# Patient Record
Sex: Female | Born: 1990 | Hispanic: Yes | Marital: Married | Smoking: Never smoker
Health system: Southern US, Community
[De-identification: ages and names within clinical notes are randomized; demographics above are authoritative.]

## PROBLEM LIST (undated history)

## (undated) ENCOUNTER — Inpatient Hospital Stay (HOSPITAL_COMMUNITY): Payer: Self-pay

## (undated) DIAGNOSIS — C539 Malignant neoplasm of cervix uteri, unspecified: Secondary | ICD-10-CM

## (undated) HISTORY — PX: LEEP: SHX91

## (undated) HISTORY — PX: DILATION AND CURETTAGE OF UTERUS: SHX78

---

## 2014-12-07 ENCOUNTER — Inpatient Hospital Stay (HOSPITAL_COMMUNITY)
Admission: AD | Admit: 2014-12-07 | Discharge: 2014-12-07 | Disposition: A | Discharge status: Home / Self Care | Payer: Self-pay | Source: Ambulatory Visit | Attending: Family Medicine | Admitting: Family Medicine

## 2014-12-07 ENCOUNTER — Inpatient Hospital Stay (HOSPITAL_COMMUNITY): Payer: Self-pay

## 2014-12-07 DIAGNOSIS — O209 Hemorrhage in early pregnancy, unspecified: Secondary | ICD-10-CM

## 2014-12-07 DIAGNOSIS — Z3A11 11 weeks gestation of pregnancy: Secondary | ICD-10-CM | POA: Insufficient documentation

## 2014-12-07 LAB — URINALYSIS, ROUTINE W REFLEX MICROSCOPIC
BILIRUBIN URINE: NEGATIVE
Glucose, UA: NEGATIVE mg/dL
Ketones, ur: NEGATIVE mg/dL
LEUKOCYTES UA: NEGATIVE
NITRITE: NEGATIVE
PROTEIN: NEGATIVE mg/dL
SPECIFIC GRAVITY, URINE: 1.015 (ref 1.005–1.030)
UROBILINOGEN UA: 0.2 mg/dL (ref 0.0–1.0)
pH: 8 (ref 5.0–8.0)

## 2014-12-07 LAB — CBC
HCT: 40.2 % (ref 36.0–46.0)
Hemoglobin: 13.5 g/dL (ref 12.0–15.0)
MCH: 29.2 pg (ref 26.0–34.0)
MCHC: 33.6 g/dL (ref 30.0–36.0)
MCV: 87 fL (ref 78.0–100.0)
Platelets: 189 10*3/uL (ref 150–400)
RBC: 4.62 MIL/uL (ref 3.87–5.11)
RDW: 12.4 % (ref 11.5–15.5)
WBC: 8 10*3/uL (ref 4.0–10.5)

## 2014-12-07 LAB — BASIC METABOLIC PANEL
ANION GAP: 9 (ref 5–15)
BUN: 13 mg/dL (ref 6–23)
CALCIUM: 9.7 mg/dL (ref 8.4–10.5)
CO2: 27 mmol/L (ref 19–32)
CREATININE: 0.79 mg/dL (ref 0.50–1.10)
Chloride: 105 mEq/L (ref 96–112)
GFR calc non Af Amer: >90 mL/min (ref >90)
Glucose, Bld: 99 mg/dL (ref 70–99)
Potassium: 3.7 mmol/L (ref 3.5–5.1)
Sodium: 141 mmol/L (ref 135–145)

## 2014-12-07 LAB — URINE MICROSCOPIC-ADD ON

## 2014-12-07 LAB — POCT PREGNANCY, URINE: Preg Test, Ur: POSITIVE — AB

## 2014-12-07 LAB — HCG, QUANTITATIVE, PREGNANCY: hCG, Beta Chain, Quant, S: 13687 m[IU]/mL — ABNORMAL HIGH (ref <5)

## 2014-12-07 NOTE — MAU Note (Signed)
Pt reports pain in lower abd, vaginal bleeding x 5 days. LMP10/16

## 2014-12-07 NOTE — Discharge Instructions (Signed)
Aborto espontneo  (Miscarriage) El aborto espontneo es la prdida de un beb que no ha nacido (feto) antes de la semana 20 del Media planner. La mayor parte de estos abortos ocurre en los primeros 3 meses. En algunos casos ocurre antes de que la mujer sepa que est [place]. Tambin se denomina "aborto espontneo" o "prdida prematura del embarazo". El aborto espontneo puede ser Ardelia Mems experiencia que afecte emocionalmente a Geologist, engineering. Converse con su mdico si tiene dudas, cmo es el proceso de [place], y sobre planes futuros de Media planner.  CAUSAS   Algunos problemas cromosmicos pueden hacer imposible que el beb se desarrolle normalmente. Los problemas con los genes o cromosomas del beb son generalmente el resultado de errores que se producen, por casualidad, cuando el embrin se divide y crece. Estos problemas no se heredan de los [place].  Infeccin en el cuello del tero.   Problemas hormonales.   Problemas en el cuello del tero, como tener un tero incompetente. Esto ocurre cuando los tejidos no son lo suficientemente fuertes como para Risk manager.   Problemas del tero, como un tero con forma anormal, los fibromas o anormalidades congnitas.   Ciertas enfermedades crnicas.   No fume, no beba alcohol, ni consuma drogas.   Traumatismos  A veces, la causa es desconocida.  SNTOMAS   Sangrado o manchado vaginal, con o sin clicos o dolor.  Dolor o clicos en el abdomen o en la cintura.  Eliminacin de lquido, tejidos o cogulos grandes por la vagina. DIAGNSTICO  El [employer] har un examen fsico. Tambin le indicar una ecografa para confirmar el aborto. Es posible que se realicen anlisis de [place].  TRATAMIENTO   En algunos casos el tratamiento no es necesario, si se eliminan naturalmente todos los tejidos embrionarios que se encontraban en el tero. Si el feto o la placenta quedan dentro del tero (aborto incompleto), pueden infectarse, los tejidos que quedan  pueden infectarse y deben retirarse. Generalmente se realiza un procedimiento de dilatacin y curetaje (D y C). Durante el procedimiento de dilatacin y curetaje, el cuello del tero se abre (dilata) y se retira cualquier resto de tejido fetal o placentario del tero.  Si hay una infeccin, le recetarn antibiticos. Podrn recetarle otros medicamentos para reducir el tamao del tero (contraerlo) si hay una mucho sangrado.  Si su sangre es Rh negativa y su beb es Rh positivo, usted necesitar la inyeccin de inmunoglobulina Rh. Esta inyeccin proteger a los futuros bebs de tener problemas de compatibilidad Rh en futuros embarazos. INSTRUCCIONES PARA EL CUIDADO EN EL HOGAR   El mdico le indicar reposo en cama o le permitir Automotive engineer. Vuelva a la actividad lentamente o segn las indicaciones de su mdico.  Pdale a alguien que la ayude con las responsabilidades familiares y del hogar durante este tiempo.   Lleve un registro de la cantidad y la saturacin de las toallas higinicas que Medical laboratory scientific officer. Anote esta informacin   No use tampones. No No se haga duchas vaginales ni tenga relaciones sexuales hasta que el mdico la autorice.   Slo tome medicamentos de venta libre o recetados para Glass blower/designer o Health and safety inspector, segn las indicaciones de su mdico.   No tome aspirina. La aspirina puede ocasionar hemorragias.   Concurra puntualmente a las citas de control con el mdico.   Si usted o su pareja tienen dificultades con el duelo, hable con su mdico para buscar la ayuda psicolgica que los ayude a enfrentar la prdida  del embarazo. Permtase el tiempo suficiente de duelo antes de quedar embarazada nuevamente.  SOLICITE ATENCIN MDICA DE INMEDIATO SI:   Siente calambres intensos o dolor en la espalda o en el abdomen.  Tiene fiebre.  Elimina grandes cogulos de [place] (del tamao de una nuez o ms) o tejidos por la vagina. Guarde lo que ha eliminado para  que su mdico lo examine.   La hemorragia aumenta.   [place] Fast secrecin vaginal espesa y con mal olor.  Se siente mareada, dbil, o se desmaya.   Siente escalofros.  ASEGRESE DE QUE:   Comprende estas instrucciones.  Controlar su enfermedad.  Solicitar ayuda de inmediato si no mejora o si empeora. Document Released: 08/30/2005 Document Revised: 03/17/2013 [facility] Patient Information 2015 [place], [place]. This information is not intended to replace advice given to you by your health care provider. Make sure you discuss any questions you have with your health care provider.

## 2014-12-07 NOTE — MAU Provider Note (Signed)
History     CSN: [id number]  Arrival date and time: 12/07/14 1845   None     Chief Complaint  Patient presents with  . Possible Pregnancy  . Vaginal Bleeding  . Abdominal Pain   HPI  Kim Sanders is a 24 y.o. G3P2 at 11 weeks based on LMP. She presents today with spotting and cramping. She states that it started yesterday, but has gotten worse today. She reports that her bleeding is now like a period. She denies passing any tissue at this time.   No past medical history on file.  No past surgical history on file.  No family history on file.  History  Substance Use Topics  . Smoking status: Not on file  . Smokeless tobacco: Not on file  . Alcohol Use: Not on file    Allergies: Allergies not on file  No prescriptions prior to admission    ROS Physical Exam   Blood pressure 122/67, pulse 66, temperature 99.9 F (37.7 C), temperature source Oral, resp. rate 18, height 5\' 5"  (1.651 m), weight 68.947 kg (152 lb), last menstrual period 09/18/2014, SpO2 100 %.  Physical Exam  Nursing note and vitals reviewed. Constitutional: She is oriented to person, place, and time. She appears well-developed and well-nourished. No distress.  Cardiovascular: Normal rate.   Respiratory: Effort normal.  GI: Soft. There is no tenderness. There is no rebound.  Neurological: She is alert and oriented to person, place, and time.  Skin: Skin is warm and dry.  Psychiatric: She has a normal mood and affect.    MAU Course  Procedures  Results for orders placed or performed during the hospital encounter of 12/07/14 (from the past 24 hour(s))  Urinalysis, Routine w reflex microscopic     Status: Abnormal   Collection Time: 12/07/14  8:09 PM  Result Value Ref Range   Color, Urine YELLOW YELLOW   APPearance HAZY (A) CLEAR   Specific Gravity, Urine 1.015 1.005 - 1.030   pH 8.0 5.0 - 8.0   Glucose, UA NEGATIVE NEGATIVE mg/dL   Hgb urine dipstick LARGE (A) NEGATIVE   Bilirubin Urine  NEGATIVE NEGATIVE   Ketones, ur NEGATIVE NEGATIVE mg/dL   Protein, ur NEGATIVE NEGATIVE mg/dL   Urobilinogen, UA 0.2 0.0 - 1.0 mg/dL   Nitrite NEGATIVE NEGATIVE   Leukocytes, UA NEGATIVE NEGATIVE  Urine microscopic-add on     Status: Abnormal   Collection Time: 12/07/14  8:09 PM  Result Value Ref Range   Squamous Epithelial / LPF FEW (A) RARE   WBC, UA 0-2 <3 WBC/hpf   RBC / HPF 3-6 <3 RBC/hpf   Bacteria, UA RARE RARE  Pregnancy, urine POC     Status: Abnormal   Collection Time: 12/07/14  8:23 PM  Result Value Ref Range   Preg Test, Ur POSITIVE (A) NEGATIVE  hCG, quantitative, pregnancy     Status: Abnormal   Collection Time: 12/07/14  8:48 PM  Result Value Ref Range   hCG, Beta Chain, Quant, S 13687 (H) <5 mIU/mL  Basic metabolic panel     Status: None   Collection Time: 12/07/14  8:48 PM  Result Value Ref Range   Sodium 141 135 - 145 mmol/L   Potassium 3.7 3.5 - 5.1 mmol/L   Chloride 105 96 - 112 mEq/L   CO2 27 19 - 32 mmol/L   Glucose, Bld 99 70 - 99 mg/dL   BUN 13 6 - 23 mg/dL   Creatinine, Ser 0.79 0.50 - 1.10  mg/dL   Calcium 9.7 8.4 - 10.5 mg/dL   GFR calc non Af Amer >90 >90 mL/min   GFR calc Af Amer >90 >90 mL/min   Anion gap 9 5 - 15  CBC     Status: None   Collection Time: 12/07/14  8:48 PM  Result Value Ref Range   WBC 8.0 4.0 - 10.5 K/uL   RBC 4.62 3.87 - 5.11 MIL/uL   Hemoglobin 13.5 12.0 - 15.0 g/dL   HCT 40.2 36.0 - 46.0 %   MCV 87.0 78.0 - 100.0 fL   MCH 29.2 26.0 - 34.0 pg   MCHC 33.6 30.0 - 36.0 g/dL   RDW 12.4 11.5 - 15.5 %   Platelets 189 150 - 400 K/uL   US Ob Comp Less 14 Wks  12/07/2014   CLINICAL DATA:  Bleeding and pelvic pain. Estimated gestational age by LMP is 11 weeks 3 days. Quantitative beta HCG is pending.  EXAM: OBSTETRIC <14 WK [place] AND TRANSVAGINAL OB US  TECHNIQUE: Both transabdominal and transvaginal ultrasound examinations were performed for complete evaluation of the gestation as well as the maternal uterus, adnexal regions, and  pelvic cul-de-sac. Transvaginal technique was performed to assess early pregnancy.  COMPARISON:  None.  FINDINGS: Intrauterine gestational sac: A single intrauterine gestational sac is identified. Sac size appear somewhat large compared to the size of the fetal pole.  Yolk sac:  Yolk sac is not visualized.  Embryo:  Fetal pole is identified.  Cardiac Activity: Fetal cardiac activity is not identified.  Heart Rate:  0 bpm  MSD:  24.9  mm   7 w   5  d  CRL:   5.3  mm   6 w 3 d                  [place] EDC: 07/25/2015  Maternal uterus/adnexae: The uterus is anteverted. No myometrial mass lesions are identified. Small subchorionic hemorrhage is suggested. Both ovaries are visualized and appear normal. No free pelvic fluid collections.  IMPRESSION: A single intrauterine gestational sac is identified. The fetal pole is identified but no fetal cardiac activity is shown. Crown-rump length measures 5.3 mm consistent with estimated gestational age of [redacted] weeks 3 days, small compared with reported LMP. Findings are suspicious but not yet definitive for failed pregnancy. Recommend follow-up US in 10-14 days for definitive diagnosis. This recommendation follows SRU consensus guidelines: Diagnostic Criteria for Nonviable Pregnancy Early in the First Trimester. [place] Med 2013; 629:5284-13.   Electronically Signed   By: Lucienne Capers M.D.   On: 12/07/2014 21:30   US Ob Transvaginal  12/07/2014   CLINICAL DATA:  Bleeding and pelvic pain. Estimated gestational age by LMP is 11 weeks 3 days. Quantitative beta HCG is pending.  EXAM: OBSTETRIC <14 WK [place] AND TRANSVAGINAL OB US  TECHNIQUE: Both transabdominal and transvaginal ultrasound examinations were performed for complete evaluation of the gestation as well as the maternal uterus, adnexal regions, and pelvic cul-de-sac. Transvaginal technique was performed to assess early pregnancy.  COMPARISON:  None.  FINDINGS: Intrauterine gestational sac: A single intrauterine gestational sac is  identified. Sac size appear somewhat large compared to the size of the fetal pole.  Yolk sac:  Yolk sac is not visualized.  Embryo:  Fetal pole is identified.  Cardiac Activity: Fetal cardiac activity is not identified.  Heart Rate:  0 bpm  MSD:  24.9  mm   7 w   5  d  CRL:  5.3  mm   6 w 3 d                  [place] EDC: 07/25/2015  Maternal uterus/adnexae: The uterus is anteverted. No myometrial mass lesions are identified. Small subchorionic hemorrhage is suggested. Both ovaries are visualized and appear normal. No free pelvic fluid collections.  IMPRESSION: A single intrauterine gestational sac is identified. The fetal pole is identified but no fetal cardiac activity is shown. Crown-rump length measures 5.3 mm consistent with estimated gestational age of [redacted] weeks 3 days, small compared with reported LMP. Findings are suspicious but not yet definitive for failed pregnancy. Recommend follow-up US in 10-14 days for definitive diagnosis. This recommendation follows SRU consensus guidelines: Diagnostic Criteria for Nonviable Pregnancy Early in the First Trimester. [place] [employer] Med 2013; 599:3570-17.   Electronically Signed   By: Lucienne Capers M.D.   On: 12/07/2014 21:30    Assessment and Plan   1. Bleeding in early pregnancy    D/W the patient at length that this likely represents SAB To confirm we will repeat HCG in 48 hours SAB and bleeding precautions reviewed Return to MAU as needed   Follow-up Information    Follow up with [facility] In 2 days.   Contact information:   [address] [id number] mc [place] [phone]       Mathis Bud 12/07/2014, 10:02 PM

## 2014-12-08 LAB — RPR

## 2014-12-08 LAB — HIV ANTIBODY (ROUTINE TESTING W REFLEX): HIV 1&2 Ab, 4th Generation: NONREACTIVE

## 2014-12-09 ENCOUNTER — Inpatient Hospital Stay (HOSPITAL_COMMUNITY)
Admission: AD | Admit: 2014-12-09 | Discharge: 2014-12-09 | Disposition: A | Discharge status: Home / Self Care | Payer: Self-pay | Source: Ambulatory Visit | Attending: Family Medicine | Admitting: Family Medicine

## 2014-12-09 ENCOUNTER — Encounter (HOSPITAL_COMMUNITY): Payer: Self-pay | Admitting: *Deleted

## 2014-12-09 DIAGNOSIS — Z3A00 Weeks of gestation of pregnancy not specified: Secondary | ICD-10-CM | POA: Insufficient documentation

## 2014-12-09 DIAGNOSIS — O039 Complete or unspecified spontaneous abortion without complication: Secondary | ICD-10-CM | POA: Insufficient documentation

## 2014-12-09 LAB — CBC
HCT: 35.6 % — ABNORMAL LOW (ref 36.0–46.0)
Hemoglobin: 12.3 g/dL (ref 12.0–15.0)
MCH: 30.1 pg (ref 26.0–34.0)
MCHC: 34.6 g/dL (ref 30.0–36.0)
MCV: 87.3 fL (ref 78.0–100.0)
PLATELETS: 168 10*3/uL (ref 150–400)
RBC: 4.08 MIL/uL (ref 3.87–5.11)
RDW: 12.3 % (ref 11.5–15.5)
WBC: 7.1 10*3/uL (ref 4.0–10.5)

## 2014-12-09 LAB — HCG, QUANTITATIVE, PREGNANCY: HCG, BETA CHAIN, QUANT, S: 5396 m[IU]/mL — AB (ref <5)

## 2014-12-09 NOTE — Discharge Instructions (Signed)
Aborto espontáneo  °(Miscarriage) ° El aborto espontáneo es la pérdida de un bebé que no ha nacido.(feto) antes de la semana 20 del embarazo. La causa generalmente es desconocida.  °CUIDADOS EN EL HOGAR  °· Debe permanecer en cama (reposo en cama) o podrá hacer actividades livianas. Regrese a sus actividades según las indicaciones del médico. °· Pida ayuda con las tareas domésticas. °· Anote cuántos apósitos usa por día. Describa el grado en que están empapados. °· No use tampones. No se higienice la vagina (duchas vaginales) ni tenga relaciones sexuales (coito) hasta que el médico la autorice. °· Sólo debe tomar la medicación según las indicaciones del médico. °· No tome aspirina. °· Cumpla con los controles médicos según las indicaciones. °· Si usted o su pareja tienen problemas con el duelo, hable con su médico. También puede intentar con psicoterapia. Permítase el tiempo suficiente de duelo antes de quedar embarazada nuevamente. °SOLICITE AYUDA DE INMEDIATO SI:  °· Siente cólicos intensos o dolor en el estómago, en la espalda o en el vientre (abdomen). °· Tiene fiebre. °· Elimina grumos de sangre (coágulos) por la vagina, que tienen el tamaño de una nuez o más. Guarde los coágulos para que el médico los vea. °· Elimina gran cantidad de tejidos por la vagina. Guarde lo que ha eliminado para que su médico lo examine. °· Aumenta el sangrado. °· Observa una secreción espesa, con mal olor (pérdida) que proviene de la vagina. °· Se siente mareada, débil o se desvanece (se desmaya). °· Siente escalofríos. °ASEGÚRESE DE QUE:  °· Comprende estas instrucciones. °· Controlará su enfermedad. °· Solicitará ayuda de inmediato si no mejora o si empeora. °Document Released: 05/21/2012 °ExitCare® Patient Information ©2015 ExitCare, LLC. This information is not intended to replace advice given to you by your health care provider. Make sure you discuss any questions you have with your health care provider. ° °

## 2014-12-09 NOTE — MAU Provider Note (Signed)
Subjective:  [name] is a 24 y.o. female G1P0 at Unknown gestation who presents to MAU for a follow up beta hcg level. She was originally seen on 1/4 for pain and bleeding in early pregnancy. She had an US done that was suspicious for a failed IUP.    She had heavy vaginal bleeding: however today it is very light.  Currently denies pain.   Objective:  GENERAL: Well-developed, well-nourished female in no acute distress.  HEENT: Normocephalic, atraumatic.   LUNGS: Effort normal HEART: Regular rate  SKIN: Warm, dry and without erythema ABDOMEN: soft, non tender  PSYCH: Normal mood and affect  Filed Vitals:   12/09/14 0901  BP: 111/65  Pulse: 84  Temp: 98.6 F (37 C)  Resp: 18   Results for orders placed or performed during the hospital encounter of 12/09/14 (from the past 48 hour(s))  hCG, quantitative, pregnancy     Status: Abnormal   Collection Time: 12/09/14  8:56 AM  Result Value Ref Range   hCG, Beta Chain, Quant, S 5396 (H) <5 mIU/mL    Comment:          GEST. AGE      CONC.  (mIU/mL)   <=1 WEEK        5 - 50     2 WEEKS       50 - 500     3 WEEKS       100 - 10,000     4 WEEKS     1,000 - 30,000     5 WEEKS     3,500 - 115,000   6-8 WEEKS     12,000 - 270,000    12 WEEKS     15,000 - 220,000        FEMALE AND NON-PREGNANT FEMALE:     LESS THAN 5 mIU/mL   CBC     Status: Abnormal   Collection Time: 12/09/14  8:58 AM  Result Value Ref Range   WBC 7.1 4.0 - 10.5 K/uL   RBC 4.08 3.87 - 5.11 MIL/uL   Hemoglobin 12.3 12.0 - 15.0 g/dL   HCT 35.6 (L) 36.0 - 46.0 %   MCV 87.3 78.0 - 100.0 fL   MCH 30.1 26.0 - 34.0 pg   MCHC 34.6 30.0 - 36.0 g/dL   RDW 12.3 11.5 - 15.5 %   Platelets 168 150 - 400 K/uL     MDM: Beta hcg level 1/4: 13687 Beta hcg level 1/6: 5396    Assessment:  1. SAB (spontaneous abortion)     Plan:  Discharge home in stable condition Follow up in the clinic in 1 week for beta hcg Bleeding precautions  Return to MAU if  symptoms worsen Pelvic rest Support given    Kim Hillock Raul Torrance, NP 12/09/2014 10:06 AM

## 2014-12-09 NOTE — MAU Note (Signed)
Heavy bleeding yesterday, passed 4 large clots and several small,  Bleeding more like a period today.  A lot of pain yesterday, none currently.

## 2014-12-17 ENCOUNTER — Other Ambulatory Visit: Payer: Self-pay

## 2015-10-07 ENCOUNTER — Inpatient Hospital Stay (HOSPITAL_COMMUNITY)
Admission: AD | Admit: 2015-10-07 | Discharge: 2015-10-07 | Disposition: A | Discharge status: Home / Self Care | Payer: Self-pay | Source: Ambulatory Visit | Attending: Obstetrics and Gynecology | Admitting: Obstetrics and Gynecology

## 2015-10-07 ENCOUNTER — Encounter (HOSPITAL_COMMUNITY): Payer: Self-pay | Admitting: *Deleted

## 2015-10-07 DIAGNOSIS — O26891 Other specified pregnancy related conditions, first trimester: Secondary | ICD-10-CM

## 2015-10-07 DIAGNOSIS — N898 Other specified noninflammatory disorders of vagina: Secondary | ICD-10-CM

## 2015-10-07 DIAGNOSIS — R1030 Lower abdominal pain, unspecified: Secondary | ICD-10-CM | POA: Insufficient documentation

## 2015-10-07 DIAGNOSIS — O4691 Antepartum hemorrhage, unspecified, first trimester: Secondary | ICD-10-CM | POA: Insufficient documentation

## 2015-10-07 DIAGNOSIS — O26899 Other specified pregnancy related conditions, unspecified trimester: Secondary | ICD-10-CM

## 2015-10-07 DIAGNOSIS — Z3A12 12 weeks gestation of pregnancy: Secondary | ICD-10-CM | POA: Insufficient documentation

## 2015-10-07 DIAGNOSIS — R109 Unspecified abdominal pain: Secondary | ICD-10-CM

## 2015-10-07 LAB — WET PREP, GENITAL
Clue Cells Wet Prep HPF POC: NONE SEEN
Trich, Wet Prep: NONE SEEN
Yeast Wet Prep HPF POC: NONE SEEN

## 2015-10-07 LAB — URINALYSIS, ROUTINE W REFLEX MICROSCOPIC
BILIRUBIN URINE: NEGATIVE
Glucose, UA: NEGATIVE mg/dL
Ketones, ur: 15 mg/dL — AB
Nitrite: NEGATIVE
Protein, ur: NEGATIVE mg/dL
Specific Gravity, Urine: 1.025 (ref 1.005–1.030)
UROBILINOGEN UA: 1 mg/dL (ref 0.0–1.0)
pH: 6 (ref 5.0–8.0)

## 2015-10-07 LAB — ABO/RH: ABO/RH(D): O POS

## 2015-10-07 LAB — URINE MICROSCOPIC-ADD ON

## 2015-10-07 LAB — POCT PREGNANCY, URINE: PREG TEST UR: POSITIVE — AB

## 2015-10-07 NOTE — MAU Note (Signed)
Pt states abd pain and bright red vaginal bleeding started on Monday.  Now she is spotting and bleeding is dark brown.  Pt had a positive UPT at health department.

## 2015-10-07 NOTE — MAU Provider Note (Signed)
History     CSN: [id number]  Arrival date and time: 10/07/15 7564   First Provider Initiated Contact with Patient 10/07/15 [phone]      Chief Complaint  Patient presents with  . Abdominal Pain  . Vaginal Bleeding   HPI   [name] is a 24 y.o. female [phone] at [redacted]w[redacted]d presenting with vaginal bleeding. The vaginal bleeding started on Monday; lasted just a few hours and then nearly stopped. She denies bleeding currently.   She complains of lower abdominal pain that comes and goes; the pain is rated 4/10; she has not taken anything for the pain. + cramping pain.  She is receiving prenatal care at the health department.   OB History    Gravida Para Term Preterm AB TAB SAB Ectopic Multiple Living   4 1 1  2  2   1       Past Medical History  Diagnosis Date  . Medical history non-contributory     Past Surgical History  Procedure Laterality Date  . Dilation and curettage of uterus      History reviewed. No pertinent family history.  Social History  Substance Use Topics  . Smoking status: Never Smoker   . Smokeless tobacco: None  . Alcohol Use: No    Allergies: No Known Allergies  No prescriptions prior to admission   Results for orders placed or performed during the hospital encounter of 10/07/15 (from the past 48 hour(s))  Urinalysis, Routine w reflex microscopic (not at [facility])     Status: Abnormal   Collection Time: 10/07/15  8:45 AM  Result Value Ref Range   Color, Urine YELLOW YELLOW   APPearance CLEAR CLEAR   Specific Gravity, Urine 1.025 1.005 - 1.030   pH 6.0 5.0 - 8.0   Glucose, UA NEGATIVE NEGATIVE mg/dL   Hgb urine dipstick TRACE (A) NEGATIVE   Bilirubin Urine NEGATIVE NEGATIVE   Ketones, ur 15 (A) NEGATIVE mg/dL   Protein, ur NEGATIVE NEGATIVE mg/dL   Urobilinogen, UA 1.0 0.0 - 1.0 mg/dL   Nitrite NEGATIVE NEGATIVE   Leukocytes, UA SMALL (A) NEGATIVE  Urine microscopic-add on     Status: Abnormal   Collection Time: 10/07/15  8:45 AM   Result Value Ref Range   Squamous Epithelial / LPF MANY (A) RARE   WBC, UA 7-10 <3 WBC/hpf   RBC / HPF 3-6 <3 RBC/hpf   Bacteria, UA MANY (A) RARE   Urine-Other MUCOUS PRESENT   Pregnancy, urine POC     Status: Abnormal   Collection Time: 10/07/15  9:06 AM  Result Value Ref Range   Preg Test, Ur POSITIVE (A) NEGATIVE    Comment:        THE SENSITIVITY OF THIS METHODOLOGY IS >24 mIU/mL     Review of Systems  Constitutional: Negative for fever and chills.  Gastrointestinal: Positive for abdominal pain. Negative for nausea and vomiting.  Genitourinary: Negative for dysuria.   Physical Exam   Blood pressure 118/68, pulse 69, temperature 97.8 F (36.6 C), temperature source Oral, resp. rate 18, last menstrual period 07/15/2015, SpO2 100 %, unknown if currently breastfeeding.  Physical Exam  Constitutional: She is oriented to person, place, and time. She appears well-developed and well-nourished. No distress.  HENT:  Head: Normocephalic.  Eyes: Pupils are equal, round, and reactive to light.  Neck: Neck supple.  GI: Soft. She exhibits no distension. There is no tenderness. There is no rebound and no guarding.  Genitourinary:  Speculum exam: Vagina -  Small amount of creamy, light brown discharge, no odor Cervix - No contact bleeding, no active bleeding  Bimanual exam: Cervix closed Uterus non tender, gravid  Adnexa non tender, no masses bilaterally GC/Chlam, wet prep done Chaperone present for exam.  Musculoskeletal: Normal range of motion.  Neurological: She is alert and oriented to person, place, and time.  Skin: Skin is warm. She is not diaphoretic.  Psychiatric: Her behavior is normal.    MAU Course  Procedures  None  MDM + fetal heart tones via doppler  O positive blood type   Assessment and Plan   A:  1. Vaginal discharge in pregnancy in first trimester   2. Abdominal pain in pregnancy    P:  Discharge home in stable condition Follow up with the  health department as scheduled Return to MAU if symptoms worsen First trimester warning signs.   Lezlie Lye, NP 10/07/2015 1:56 PM

## 2015-10-07 NOTE — Discharge Instructions (Signed)
Dolor abdominal en el embarazo (Abdominal Pain During Pregnancy) El dolor abdominal es frecuente durante el embarazo. Generalmente no causa ningn dao. El dolor abdominal puede tener numerosas causas. Algunas causas son ms graves que otras. Ciertas causas de dolor abdominal durante el embarazo se diagnostican fcilmente. A veces, se tarda un tiempo para llegar al diagnstico. Otras veces la causa no se conoce. El dolor abdominal puede estar relacionado con [place] alteracin del [place], o puede deberse a una causa totalmente diferente. Por este motivo, siempre consulte a su mdico cuando sienta molestias abdominales. INSTRUCCIONES PARA EL CUIDADO EN EL HOGAR  Est atenta al dolor para ver si hay cambios. Las siguientes indicaciones ayudarn a [employer] pueda sentir:  No Consulting civil engineer sexuales y no coloque nada dentro de la vagina hasta que los sntomas hayan desaparecido completamente.  Descanse todo lo que pueda [employer] dolor se le haya calmado.  Si siente nuseas, beba lquidos claros. Evite los alimentos slidos mientras sienta malestar o tenga nuseas.  Tome slo medicamentos de venta libre o recetados, segn las indicaciones del mdico.  Cumpla con todas las visitas de control, segn le indique su mdico. SOLICITE ATENCIN MDICA DE INMEDIATO SI:  Tiene un sangrado, prdida de lquidos o elimina tejidos por la vagina.  El dolor o los clicos [place].  Tiene vmitos persistentes.  Comienza a Education officer, environmental al orinar u [employer]  Tiene fiebre.  Nota que los movimientos del beb disminuyen.  Siente intensa debilidad o se marea.  Tiene dificultad para respirar con o sin dolor abdominal.  Siente un dolor de cabeza intenso junto al dolor abdominal.  Wilma Flavin secrecin vaginal anormal con dolor abdominal.  Tiene diarrea persistente.  El dolor abdominal sigue o empeora an despus de Magazine features editor. ASEGRESE DE QUE:   Comprende estas  instrucciones.  Controlar su afeccin.  Recibir ayuda de inmediato si no mejora o si empeora.   Esta informacin no tiene Marine scientist el consejo del mdico. Asegrese de hacerle al mdico cualquier pregunta que tenga.   Document Released: 11/20/2005 Document Revised: 09/10/2013 Elsevier Interactive Patient Education [employer].

## 2015-10-08 LAB — GC/CHLAMYDIA PROBE AMP (~~LOC~~) NOT AT ARMC
Chlamydia: NEGATIVE
Neisseria Gonorrhea: NEGATIVE

## 2015-10-08 LAB — CULTURE, OB URINE: SPECIAL REQUESTS: NORMAL

## 2015-10-08 LAB — HIV ANTIBODY (ROUTINE TESTING W REFLEX): HIV Screen 4th Generation wRfx: NONREACTIVE

## 2015-11-25 LAB — OB RESULTS CONSOLE RUBELLA ANTIBODY, IGM: RUBELLA: IMMUNE

## 2015-11-25 LAB — OB RESULTS CONSOLE RPR: RPR: NONREACTIVE

## 2015-11-25 LAB — OB RESULTS CONSOLE GC/CHLAMYDIA
CHLAMYDIA, DNA PROBE: NEGATIVE
Gonorrhea: NEGATIVE

## 2015-11-25 LAB — OB RESULTS CONSOLE ANTIBODY SCREEN: Antibody Screen: NEGATIVE

## 2015-11-25 LAB — OB RESULTS CONSOLE HEPATITIS B SURFACE ANTIGEN: HEP B S AG: NEGATIVE

## 2015-11-25 LAB — OB RESULTS CONSOLE HIV ANTIBODY (ROUTINE TESTING): HIV: NONREACTIVE

## 2015-12-05 NOTE — L&D Delivery Note (Signed)
Delivery Note At 11:35 PM a viable female was delivered via  (Presentation:vertex ;  LOA).  APGAR:9 , 9; weight  .   Placenta status:spont ,via shultz .  Cord:3vc  with the following complications:none .  Cord pH: n/a  Anesthesia: None  Episiotomy: None Lacerations:  2nd Suture Repair: 3.0 vicryl rapide Est. Blood Loss 150 (mL):    Mom to postpartum.  Baby to Couplet care / Skin to Skin.  [name] 04/16/2016, 11:54 PM

## 2016-03-27 LAB — OB RESULTS CONSOLE GBS: GBS: NEGATIVE

## 2016-04-16 ENCOUNTER — Inpatient Hospital Stay (HOSPITAL_COMMUNITY)
Admission: AD | Admit: 2016-04-16 | Discharge: 2016-04-18 | DRG: 775 | Disposition: A | Discharge status: Home / Self Care | Payer: Medicaid Other | Source: Ambulatory Visit | Attending: Obstetrics & Gynecology | Admitting: Obstetrics & Gynecology

## 2016-04-16 DIAGNOSIS — Z3A39 39 weeks gestation of pregnancy: Secondary | ICD-10-CM

## 2016-04-16 LAB — CBC
HEMATOCRIT: 38.4 % (ref 36.0–46.0)
HEMOGLOBIN: 13 g/dL (ref 12.0–15.0)
MCH: 28.8 pg (ref 26.0–34.0)
MCHC: 33.9 g/dL (ref 30.0–36.0)
MCV: 85.1 fL (ref 78.0–100.0)
Platelets: 161 10*3/uL (ref 150–400)
RBC: 4.51 MIL/uL (ref 3.87–5.11)
RDW: 13.7 % (ref 11.5–15.5)
WBC: 10.2 10*3/uL (ref 4.0–10.5)

## 2016-04-16 LAB — TYPE AND SCREEN
ABO/RH(D): O POS
ANTIBODY SCREEN: NEGATIVE

## 2016-04-16 MED ORDER — ACETAMINOPHEN 325 MG PO TABS
650.0000 mg | ORAL_TABLET | ORAL | Status: DC | PRN
  Administered 2016-04-17: 650 mg via ORAL

## 2016-04-16 MED ORDER — OXYTOCIN 40 UNITS IN LACTATED RINGERS INFUSION - SIMPLE MED: 2.5000 [IU]/h | INTRAVENOUS | Status: DC

## 2016-04-16 MED ORDER — LIDOCAINE HCL (PF) 1 % IJ SOLN
INTRAMUSCULAR | Status: AC
  Administered 2016-04-16: 30 mL via SUBCUTANEOUS
  Filled 2016-04-16: qty 30

## 2016-04-16 MED ORDER — TERBUTALINE SULFATE 1 MG/ML IJ SOLN
0.2500 mg | Freq: Once | INTRAMUSCULAR | Status: DC | PRN
  Filled 2016-04-16: qty 1

## 2016-04-16 MED ORDER — OXYTOCIN 40 UNITS IN LACTATED RINGERS INFUSION - SIMPLE MED
INTRAVENOUS | Status: AC
  Administered 2016-04-16: 4 m[IU]/min via INTRAVENOUS
  Filled 2016-04-16: qty 1000

## 2016-04-16 MED ORDER — FLEET ENEMA 7-19 GM/118ML RE ENEM: 1.0000 | ENEMA | RECTAL | Status: DC | PRN

## 2016-04-16 MED ORDER — LACTATED RINGERS IV SOLN: 500.0000 mL | INTRAVENOUS | Status: DC | PRN

## 2016-04-16 MED ORDER — FENTANYL CITRATE (PF) 100 MCG/2ML IJ SOLN
100.0000 ug | Freq: Once | INTRAMUSCULAR | Status: AC
  Administered 2016-04-16: 100 ug via INTRAVENOUS

## 2016-04-16 MED ORDER — LIDOCAINE HCL (PF) 1 % IJ SOLN
30.0000 mL | INTRAMUSCULAR | Status: DC | PRN
  Administered 2016-04-16: 30 mL via SUBCUTANEOUS
  Filled 2016-04-16: qty 30

## 2016-04-16 MED ORDER — FENTANYL CITRATE (PF) 100 MCG/2ML IJ SOLN
INTRAMUSCULAR | Status: AC
  Administered 2016-04-16: 100 ug via INTRAVENOUS
  Filled 2016-04-16: qty 2

## 2016-04-16 MED ORDER — OXYTOCIN BOLUS FROM INFUSION
500.0000 mL | INTRAVENOUS | Status: DC
  Administered 2016-04-16: 500 mL via INTRAVENOUS

## 2016-04-16 MED ORDER — OXYTOCIN 40 UNITS IN LACTATED RINGERS INFUSION - SIMPLE MED
1.0000 m[IU]/min | INTRAVENOUS | Status: DC
  Administered 2016-04-16: 4 m[IU]/min via INTRAVENOUS

## 2016-04-16 MED ORDER — IBUPROFEN 600 MG PO TABS
600.0000 mg | ORAL_TABLET | Freq: Four times a day (QID) | ORAL | Status: DC
  Administered 2016-04-17 – 2016-04-18 (×7): 600 mg via ORAL
  Filled 2016-04-16 (×7): qty 1

## 2016-04-16 MED ORDER — ONDANSETRON HCL 4 MG/2ML IJ SOLN: 4.0000 mg | Freq: Four times a day (QID) | INTRAMUSCULAR | Status: DC | PRN

## 2016-04-16 MED ORDER — LACTATED RINGERS IV SOLN
INTRAVENOUS | Status: DC
  Administered 2016-04-16: 23:00:00 via INTRAVENOUS

## 2016-04-16 MED ORDER — OXYCODONE-ACETAMINOPHEN 5-325 MG PO TABS: 2.0000 | ORAL_TABLET | ORAL | Status: DC | PRN

## 2016-04-16 MED ORDER — OXYCODONE-ACETAMINOPHEN 5-325 MG PO TABS: 1.0000 | ORAL_TABLET | ORAL | Status: DC | PRN

## 2016-04-16 MED ORDER — CITRIC ACID-SODIUM CITRATE 334-500 MG/5ML PO SOLN: 30.0000 mL | ORAL | Status: DC | PRN

## 2016-04-16 NOTE — MAU Note (Signed)
Pt c/o contractions since 10pm every 5 mins. Denies LOf, having some bloody mucous. +FM. 1cm 3 weeks ago.

## 2016-04-16 NOTE — MAU Note (Signed)
Pt. Uncomfortable. SVE done pt's water broke during exam. Charge Rn called. Room 166 given for admission.

## 2016-04-16 NOTE — MAU Note (Signed)
Pt. Transferred via bed to room 166. Family at bedside.

## 2016-04-16 NOTE — Progress Notes (Signed)
Assisted RN and Midwife with interpretation of patient admit to L & D and patient assessment.  Spanish Interpreter

## 2016-04-16 NOTE — H&P (Signed)
Kim Sanders is a 25 y.o. female [id number] @ 39.3 wks presenting for active labor and SROM . GBS neg, light mec noted on admit. History OB History    Gravida Para Term Preterm AB TAB SAB Ectopic Multiple Living   4 1 1  2  2   1      Past Medical History  Diagnosis Date  . Medical history non-contributory    Past Surgical History  Procedure Laterality Date  . Dilation and curettage of uterus     Family History: family history is not on file. Social History:  reports that she has never smoked. She does not have any smokeless tobacco history on file. She reports that she does not drink alcohol or use illicit drugs.   Prenatal Transfer Tool  Maternal Diabetes: No Genetic Screening: Normal Maternal Ultrasounds/Referrals: Normal Fetal Ultrasounds or other Referrals:  None Maternal Substance Abuse:  No Significant Maternal Medications:  None Significant Maternal Lab Results:  None Other Comments:  None  Review of Systems  Constitutional: Negative.   HENT: Negative.   Eyes: Negative.   Cardiovascular: Negative.   Gastrointestinal: Positive for abdominal pain.  Genitourinary: Negative.   Musculoskeletal: Negative.   Skin: Negative.   Neurological: Negative.   Endo/Heme/Allergies: Negative.   Psychiatric/Behavioral: Negative.       Blood pressure 120/78, pulse 97, temperature 98.5 F (36.9 C), temperature source Oral, resp. rate 18, weight 176 lb (79.833 kg), last menstrual period 07/15/2015, unknown if currently breastfeeding. Maternal Exam:  Uterine Assessment: Contraction strength is moderate.  Abdomen: Patient reports no abdominal tenderness. Fetal presentation: vertex  Introitus: Normal vulva. Normal vagina.  Amniotic fluid character: meconium stained.  Pelvis: adequate for delivery.   Cervix: Cervix evaluated by digital exam.     Fetal Exam Fetal Monitor Review: Mode: ultrasound.   Variability: moderate (6-25 bpm).   Pattern: accelerations present and no  decelerations.    Fetal State Assessment: Category II - tracings are indeterminate.     Physical Exam  Constitutional: She is oriented to person, place, and time. She appears well-developed and well-nourished.  HENT:  Head: Normocephalic.  Eyes: Pupils are equal, round, and reactive to light.  Neck: Normal range of motion.  Cardiovascular: Normal rate, regular rhythm, normal heart sounds and intact distal pulses.   Respiratory: Effort normal and breath sounds normal.  GI: Soft. Bowel sounds are normal.  Genitourinary: Vagina normal and uterus normal.  Musculoskeletal: Normal range of motion.  Neurological: She is alert and oriented to person, place, and time. She has normal reflexes.  Skin: Skin is warm and dry.  Psychiatric: She has a normal mood and affect. Her behavior is normal. Judgment and thought content normal.    Prenatal labs: ABO, Rh: --/--/O POS (11/03 0940) Antibody: Negative (12/22 0000) Rubella: Immune (12/22 0000) RPR: Nonreactive (12/22 0000)  HBsAg: Negative (12/22 0000)  HIV: Non-reactive (12/22 0000)  GBS: Negative (04/24 0000)   Assessment/Plan: SVE 6/90/-1 gross ROM light mec noted. Admit. GBS neg   [name] 04/16/2016, 10:40 PM

## 2016-04-17 ENCOUNTER — Encounter (HOSPITAL_COMMUNITY): Payer: Self-pay

## 2016-04-17 MED ORDER — BENZOCAINE-MENTHOL 20-0.5 % EX AERO
1.0000 "application " | INHALATION_SPRAY | CUTANEOUS | Status: DC | PRN
  Administered 2016-04-17: 1 via TOPICAL
  Filled 2016-04-17: qty 56

## 2016-04-17 MED ORDER — SODIUM CHLORIDE 0.9 % IV SOLN: 250.0000 mL | INTRAVENOUS | Status: DC | PRN

## 2016-04-17 MED ORDER — ONDANSETRON HCL 4 MG/2ML IJ SOLN: 4.0000 mg | INTRAMUSCULAR | Status: DC | PRN

## 2016-04-17 MED ORDER — ZOLPIDEM TARTRATE 5 MG PO TABS: 5.0000 mg | ORAL_TABLET | Freq: Every evening | ORAL | Status: DC | PRN

## 2016-04-17 MED ORDER — SODIUM CHLORIDE 0.9% FLUSH: 3.0000 mL | Freq: Two times a day (BID) | INTRAVENOUS | Status: DC

## 2016-04-17 MED ORDER — COCONUT OIL OIL: 1.0000 "application " | TOPICAL_OIL | Status: DC | PRN

## 2016-04-17 MED ORDER — WITCH HAZEL-GLYCERIN EX PADS
1.0000 "application " | MEDICATED_PAD | CUTANEOUS | Status: DC | PRN
  Administered 2016-04-17: 1 via TOPICAL

## 2016-04-17 MED ORDER — DIBUCAINE 1 % RE OINT
1.0000 "application " | TOPICAL_OINTMENT | RECTAL | Status: DC | PRN
  Administered 2016-04-17: 1 via RECTAL
  Filled 2016-04-17: qty 28

## 2016-04-17 MED ORDER — ACETAMINOPHEN 325 MG PO TABS
650.0000 mg | ORAL_TABLET | ORAL | Status: DC | PRN
  Filled 2016-04-17: qty 2

## 2016-04-17 MED ORDER — TETANUS-DIPHTH-ACELL PERTUSSIS 5-2.5-18.5 LF-MCG/0.5 IM SUSP: 0.5000 mL | Freq: Once | INTRAMUSCULAR | Status: DC

## 2016-04-17 MED ORDER — ONDANSETRON HCL 4 MG PO TABS
4.0000 mg | ORAL_TABLET | ORAL | Status: DC | PRN
Start: 2016-04-17 — End: 2016-04-18

## 2016-04-17 MED ORDER — PRENATAL MULTIVITAMIN CH
1.0000 | ORAL_TABLET | Freq: Every day | ORAL | Status: DC
  Administered 2016-04-17 – 2016-04-18 (×2): 1 via ORAL
  Filled 2016-04-17 (×2): qty 1

## 2016-04-17 MED ORDER — MEASLES, MUMPS & RUBELLA VAC ~~LOC~~ INJ
0.5000 mL | INJECTION | Freq: Once | SUBCUTANEOUS | Status: DC
  Filled 2016-04-17: qty 0.5

## 2016-04-17 MED ORDER — DIPHENHYDRAMINE HCL 25 MG PO CAPS: 25.0000 mg | ORAL_CAPSULE | Freq: Four times a day (QID) | ORAL | Status: DC | PRN

## 2016-04-17 MED ORDER — SENNOSIDES-DOCUSATE SODIUM 8.6-50 MG PO TABS: 2.0000 | ORAL_TABLET | ORAL | Status: DC

## 2016-04-17 MED ORDER — SIMETHICONE 80 MG PO CHEW: 80.0000 mg | CHEWABLE_TABLET | ORAL | Status: DC | PRN

## 2016-04-17 MED ORDER — SODIUM CHLORIDE 0.9% FLUSH: 3.0000 mL | INTRAVENOUS | Status: DC | PRN

## 2016-04-17 NOTE — Lactation Note (Signed)
This note was copied from a baby's chart. Lactation Consultation Note  Patient Name: Kim Sanders Today's Date: 04/17/2016 Reason for consult: Follow-up assessment;Hyperbilirubinemia  Baby 37 hours old. Spanish in-house interpreter "[name]" assisted with consultation. Baby just place on double phototherapy. Mom reports that baby sleepy at breast. Discussed hyperbilirubinemia and baby's sleepiness and the importance of nursing. At first baby was very sleepy at breast and would not latch. Assisted mom with hand expression and gave baby several drops EBM with spoon. Baby woke up and started cueing to nurse. Mom able to keep light on baby's back and latch baby in cradle position to left breast. Baby maintained deep latch and suckled rhythmically with a few swallows noted. Demonstrated to mom how to hand express breasts, going from side to side to get EBM flowing. Also demonstrated how to stimulate baby to continue suckling. Enc mom to let nurse know if baby not nursing well. Discussed the need to keep baby fed and protect mom's milk supply. Discussed starting a DEBP as needed. Discussed assessment and interventions with patient's bedside nurse, Darleene Cleaver, RN.  Maternal Data    Feeding Feeding Type: Breast Fed Length of feed:  (LC assessed first 10 minutes of BF.)  LATCH Score/Interventions Latch: Repeated attempts needed to sustain latch, nipple held in mouth throughout feeding, stimulation needed to elicit sucking reflex. Intervention(s): Adjust position;Assist with latch;Breast compression  Audible Swallowing: A few with stimulation  Type of Nipple: Everted at rest and after stimulation  Comfort (Breast/Nipple): Soft / non-tender     Hold (Positioning): Assistance needed to correctly position infant at breast and maintain latch.  LATCH Score: 7  Lactation Tools Discussed/Used     Consult Status Consult Status: Follow-up Date: 04/18/16 Follow-up type: In-patient    [name] 04/17/2016, 12:51 PM

## 2016-04-17 NOTE — Progress Notes (Signed)
Post Partum Day 1 Subjective: no complaints, up ad lib, voiding and tolerating PO  Objective: Blood pressure 109/63, pulse 77, temperature 98.6 F (37 C), temperature source Oral, resp. rate 20, height 5\' 5"  (1.651 m), weight 176 lb (79.833 kg), last menstrual period 07/15/2015, unknown if currently breastfeeding.  Physical Exam:  General: alert, cooperative, appears stated age and no distress Lochia: appropriate Uterine Fundus: firm Incision: healing well, no dehiscence DVT Evaluation: No evidence of DVT seen on physical exam. No cords or calf tenderness. No significant calf/ankle edema.   Recent Labs  04/16/16 2250  HGB 13.0  HCT 38.4    Assessment/Plan: Plan for discharge tomorrow   LOS: 1 day   Kim Sanders 04/17/2016, 7:07 AM

## 2016-04-17 NOTE — Lactation Note (Signed)
This note was copied from a baby's chart. Lactation Consultation Note Mom BF her now 25 yr old daughter for 6 months. Mom plans to Bf exclusively for 1 months then do breast and formula. Mom speaks fair English, but if didn't understand family member would translate and mom would tell me in English the answer. Mom holding baby, encouraged STS. Mom has large breast w/easily stimulated nipples. Hand expression w/colostrum noted. Mom encouraged to feed baby 8-12 times/24 hours and with feeding cues. Referred to Baby and Me Book in Breastfeeding section Pg. 22-23 for position options and Proper latch demonstration. Educated about newborn behavior. [place] brochure given w/resources, support groups and [facility] services. Patient Name: Kim Sanders Today's Date: 04/17/2016 Reason for consult: Initial assessment   Maternal Data Has patient been taught Hand Expression?: Yes Does the patient have breastfeeding experience prior to this delivery?: Yes  Feeding    LATCH Score/Interventions       Type of Nipple: Everted at rest and after stimulation  Comfort (Breast/Nipple): Soft / non-tender           Lactation Tools Discussed/Used     Consult Status Consult Status: Follow-up Date: 04/17/16 (pm) Follow-up type: In-patient    Theodoro Kalata 04/17/2016, 5:27 AM

## 2016-04-18 LAB — RPR: RPR: NONREACTIVE

## 2016-04-18 MED ORDER — IBUPROFEN 600 MG PO TABS: 600.0000 mg | ORAL_TABLET | Freq: Four times a day (QID) | ORAL | Status: DC

## 2016-04-18 NOTE — Discharge Instructions (Signed)
Parto vaginal, Cuidados posteriores  °(Vaginal Delivery, Care After) °Siga estas instrucciones durante las próximas semanas. Estas indicaciones para el alta le proporcionan información general acerca de cómo deberá cuidarse después del parto. El médico también podrá darle instrucciones específicas. El tratamiento ha sido planificado según las prácticas médicas actuales, pero en algunos casos pueden ocurrir problemas. Comuníquese con el médico si tiene algún problema o tiene preguntas al volver a su casa.  °INSTRUCCIONES PARA EL CUIDADO EN EL HOGAR  °· Tome sólo medicamentos de venta libre o recetados, según las indicaciones del médico o del farmacéutico. °· No beba alcohol, especialmente si está amamantando o toma analgésicos. °· No mastique tabaco ni fume. °· No consuma drogas. °· Continúe con un adecuado cuidado perineal. El buen cuidado perineal incluye: °¨ Higienizarse de adelante hacia atrás. °¨ Mantener la zona perineal limpia. °· No use tampones ni duchas vaginales hasta que su médico la autorice. °· Dúchese, lávese el cabello y tome baños de inmersión según las indicaciones de su médico. °· Utilice un sostén que le ajuste bien y que brinde buen soporte a sus mamas. °· Consuma alimentos saludables. °· Beba suficiente líquido para mantener la orina clara o de color amarillo pálido. °· Consuma alimentos ricos en fibra como cereales y panes integrales, arroz, frijoles y frutas y verduras frescas todos los días. Estos alimentos pueden ayudarla a prevenir o aliviar el estreñimiento. °· Siga las recomendaciones de su médico relacionadas con la reanudación de actividades como subir escaleras, conducir automóviles, levantar objetos, hacer ejercicios o viajar. °· Hable con su médico acerca de reanudar la actividad sexual. Volver a la actividad sexual depende del riesgo de infección, la velocidad de la curación y la comodidad y su deseo de reanudarla. °· Trate de que alguien la ayude con las actividades del hogar y con  el recién nacido al menos durante un par de días después de salir del hospital. °· Descanse todo lo que pueda. Trate de descansar o tomar una siesta mientras el bebé está durmiendo. °· Aumente sus actividades gradualmente. °· Cumpla con todas las visitas de control programadas para después del parto. Es muy importante asistir a todas las citas programadas de seguimiento. En estas citas, su médico va a controlarla para asegurarse de que esté sanando física y emocionalmente. °SOLICITE ATENCIÓN MÉDICA SI:  °· Elimina coágulos grandes por la vagina. Guarde algunos coágulos para mostrarle al médico. °· Tiene una secreción con feo olor que proviene de la vagina. °· Tiene dificultad para orinar. °· Orina con frecuencia. °· Siente dolor al orinar. °· Nota un cambio en sus movimientos intestinales. °· Aumenta el enrojecimiento, el dolor o la hinchazón en la zona de la incisión vaginal (episiotomía) o el desgarro vaginal. °· Tiene pus que drena por la episiotomía o el desgarro vaginal. °· La episiotomía o el desgarro vaginal se abren. °· Sus mamas le duelen, están duras o enrojecidas. °· Sufre un dolor intenso de cabeza. °· Tiene visión borrosa o ve manchas. °· Se siente triste o deprimida. °· Tiene pensamientos acerca de lastimarse o dañar al recién nacido. °· Tiene preguntas acerca de su cuidado personal, el cuidado del recién nacido o acerca de los medicamentos. °· Se siente mareada o sufre un desmayo. °· Tiene una erupción. °· Tiene náuseas o vómitos. °· Usted amamantó al bebé y no ha tenido su período menstrual dentro de las 12 semanas después de dejar de amamantar. °· No amamanta al bebé y no tuvo su período menstrual en las últimas 12° semanas después del   partoJaclynn Guarneri. SOLICITE ATENCIN MDICA DE INMEDIATO SI:   Siente dolor persistente.  Siente dolor en el pecho.  Le falta el aire.  Se desmaya.  Siente dolor en la pierna.  Siente Research scientist (life sciences).  El sangrado vaginal satura dos o ms  apsitos en 1 hora.   Esta informacin no tiene Marine scientist el consejo del mdico. Asegrese de hacerle al mdico cualquier pregunta que tenga.   Document Released: 11/20/2005 Document Revised: 08/11/2015 Elsevier Interactive Patient Education [employer].

## 2016-04-18 NOTE — Lactation Note (Signed)
This note was copied from a baby's chart. Lactation Consultation Note: Infant is 56 hours old and is under photo therapy . Mother speaks both [place] and [place]. Mothers daughter at the bedside for interpreting.   Mother bottle feeding formula when I arrived for consult. Mother states that she breastfeeds infant first and then supplements with formula.  Discussed mothers goals for exclusively giving EBM.  Sat up DEBP and assist with pumping. Fit with a #30 flange. Mother pumped a few drops.  Advised mother to pump after breastfeeding. Reviewed collection and storage of breastmilk. Mother receptive to all teaching.   Patient Name: Kim Sanders [id number] Date: 04/18/2016 Reason for consult: Follow-up assessment   Maternal Data    Feeding Feeding Type: Bottle Fed - Formula Nipple Type: Slow - flow  LATCH Score/Interventions                      Lactation Tools Discussed/Used     Consult Status      [name] 04/18/2016, 11:27 AM

## 2016-04-18 NOTE — Discharge Summary (Signed)
OB Discharge Summary     Patient Name: Kim Sanders DOB: Nov 29, 1991 MRN: [id number]  Date of admission: 04/16/2016 Delivering MD: Daiva Nakayama   Date of discharge: 04/18/2016  Admitting diagnosis: 39 WKS, CTXS Intrauterine pregnancy: [redacted]w[redacted]d     Secondary diagnosis:  Active Problems:   Term pregnancy   NSVD (normal spontaneous vaginal delivery)  Additional problems: none     Discharge diagnosis: Term Pregnancy Delivered                                                                                                Post partum procedures:none  Augmentation: none  Complications: None  Hospital course:  Onset of Labor With Vaginal Delivery     25 y.o. yo [id number] at [redacted]w[redacted]d was admitted in Active Labor on 04/16/2016. Patient had an uncomplicated labor course as follows:  Membrane Rupture Time/Date: 10:24 PM ,04/16/2016   Intrapartum Procedures: Episiotomy: None [1]                                         Lacerations:  2nd degree [3];Perineal [11]  Patient had a delivery of a Viable infant. 04/16/2016  Information for the patient's newborn:  Luevenia Maxin [id number]  Delivery Method: Vaginal, Spontaneous Delivery (Filed from Delivery Summary)    Pateint had an uncomplicated postpartum course.  She is ambulating, tolerating a regular diet, passing flatus, and urinating well. Patient is discharged home in stable condition on 04/18/2016.    Physical exam  Filed Vitals:   04/17/16 0230 04/17/16 0615 04/17/16 1750 04/18/16 0530  BP: 104/58 109/63 106/62 101/69  Pulse: 75 77 77 77  Temp: 98.7 F (37.1 C) 98.6 F (37 C) 97.9 F (36.6 C) 97.8 F (36.6 C)  TempSrc: Oral Oral Oral Oral  Resp: 18 20 18 18   Height:      Weight:       General: alert, cooperative and no distress Lochia: appropriate Uterine Fundus: firm Incision: N/A DVT Evaluation: No cords or calf tenderness. No significant calf/ankle edema. Labs: Lab Results  Component Value Date   WBC 10.2  04/16/2016   HGB 13.0 04/16/2016   HCT 38.4 04/16/2016   MCV 85.1 04/16/2016   PLT 161 04/16/2016   CMP Latest Ref Rng 12/07/2014  Glucose 70 - 99 mg/dL 99  BUN 6 - 23 mg/dL 13  Creatinine 0.50 - 1.10 mg/dL 0.79  Sodium 135 - 145 mmol/L 141  Potassium 3.5 - 5.1 mmol/L 3.7  Chloride 96 - 112 mEq/L 105  CO2 19 - 32 mmol/L 27  Calcium 8.4 - 10.5 mg/dL 9.7    Discharge instruction: per After Visit Summary and "Baby and Me Booklet".  After visit meds:    Medication List    ASK your doctor about these medications        multivitamin-prenatal 27-0.8 MG Tabs tablet  Take 1 tablet by mouth daily at 12 noon.        Diet: routine diet  Activity: Advance as tolerated. Pelvic rest  for 6 weeks.   Outpatient follow up:6 weeks Follow up Appt:No future appointments. Follow up Visit:No Follow-up on file.  Postpartum contraception: Depo Provera  Newborn Data: Live born female  Birth Weight: 8 lb 1.5 oz (3671 g) APGAR: 9, 9  Baby Feeding: Bottle and Breast Disposition:home with mother     04/18/2016 Desma Maxim, MD

## 2016-04-19 ENCOUNTER — Ambulatory Visit: Payer: Self-pay

## 2016-04-19 NOTE — Lactation Note (Signed)
This note was copied from a baby's chart. Lactation Consultation Note DEBP 2 week rental given to mom, received $40 cash payment.   Patient Name: Kim Sanders Today's Date: 04/19/2016     Maternal Data    Feeding Feeding Type: Breast Fed Nipple Type: Slow - flow Length of feed: 15 min  LATCH Score/Interventions Latch: Grasps breast easily, tongue down, lips flanged, rhythmical sucking. Intervention(s): Adjust position;Assist with latch;Breast massage;Breast compression  Audible Swallowing: Spontaneous and intermittent  Type of Nipple: Everted at rest and after stimulation  Comfort (Breast/Nipple): Filling, red/small blisters or bruises, mild/mod discomfort  Problem noted: Filling;Mild/Moderate discomfort Interventions (Filling): Massage;Frequent nursing Interventions (Mild/moderate discomfort): Hand massage;Pre-pump if needed  Hold (Positioning): Assistance needed to correctly position infant at breast and maintain latch.  LATCH Score: 8  Lactation Tools Discussed/Used     Consult Status      Shoptaw, Justine Null 04/19/2016, 2:11 PM

## 2016-04-19 NOTE — Lactation Note (Signed)
This note was copied from a baby's chart. Lactation Consultation Note  Patient Name: Kim Sanders Today's Date: 04/19/2016 Reason for consult: Follow-up assessment;Hyperbilirubinemia Baby 6 hours old and still on double phototherapy. Assisted by in-house Spanish interpreter [place]. Mom reports that she is offering the breast first with cues, and then is supplementing with EBM/formula. Mom reports that she is pumping after feeding the baby, but is only getting 10-15 ml of EBM. Enc mom to continue BF and pumping with each feeding. Mom given paperwork for DEBP 2-week rental and mom to call when ready for pump. Mom's bedside nurse, Roselyn Reef, RN, will assist mom with latching baby. Mom aware of OP/BFSG and [place] phone line assistance after D/C.   Maternal Data    Feeding Feeding Type: Breast Fed Nipple Type: Slow - flow Length of feed: 15 min  LATCH Score/Interventions Latch: Grasps breast easily, tongue down, lips flanged, rhythmical sucking. Intervention(s): Adjust position;Assist with latch;Breast massage;Breast compression  Audible Swallowing: Spontaneous and intermittent  Type of Nipple: Everted at rest and after stimulation  Comfort (Breast/Nipple): Filling, red/small blisters or bruises, mild/mod discomfort  Problem noted: Filling;Mild/Moderate discomfort Interventions (Filling): Massage;Frequent nursing Interventions (Mild/moderate discomfort): Hand massage;Pre-pump if needed  Hold (Positioning): Assistance needed to correctly position infant at breast and maintain latch.  LATCH Score: 8  Lactation Tools Discussed/Used     Consult Status Consult Status: PRN    [name] 04/19/2016, 2:21 PM

## 2016-06-21 IMAGING — US US OB TRANSVAGINAL
1 series · 13 of 28 positions shown · non-contrast
Comparison: None.

CLINICAL DATA: Bleeding and pelvic pain. Estimated gestational age
by LMP is 11 weeks 3 days. Quantitative beta HCG is pending.

EXAM:
OBSTETRIC <14 WK US AND TRANSVAGINAL OB US
TECHNIQUE: Both transabdominal and transvaginal ultrasound examinations were
performed for complete evaluation of the gestation as well as the
maternal uterus, adnexal regions, and pelvic cul-de-sac.
Transvaginal technique was performed to assess early pregnancy.

[Series 1: us ob comp less 14 wks · 46 acquisitions, 13 frames shown]
[im 2/46]
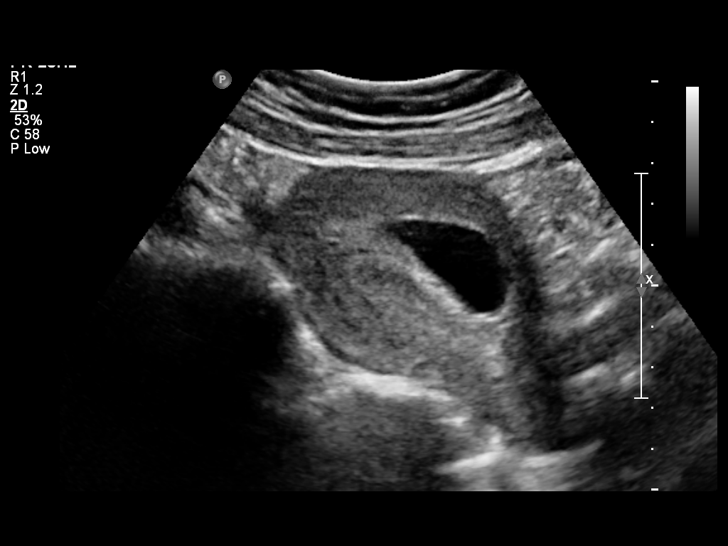
[im 6/46]
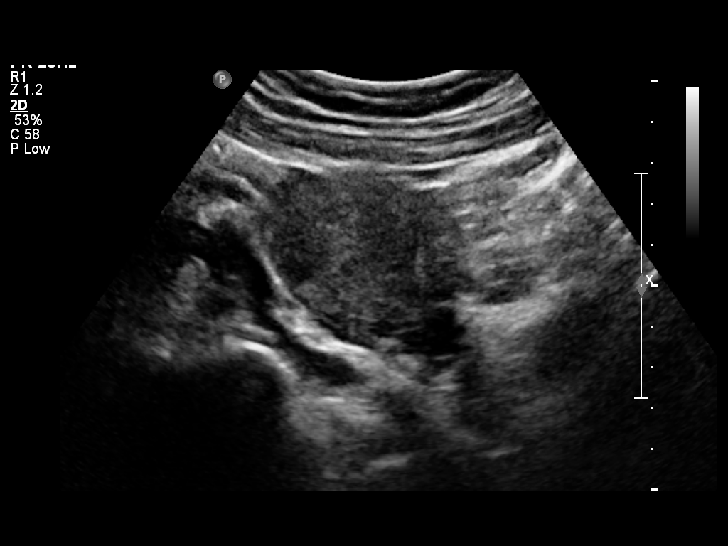
[im 9/46]
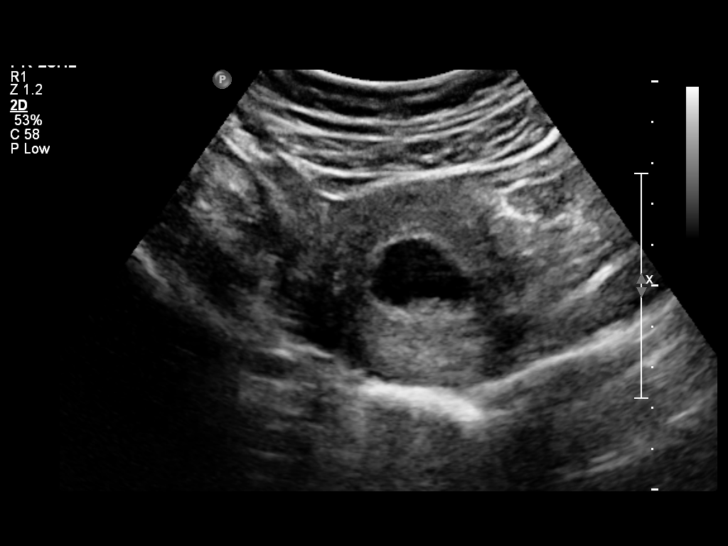
[im 12/46]
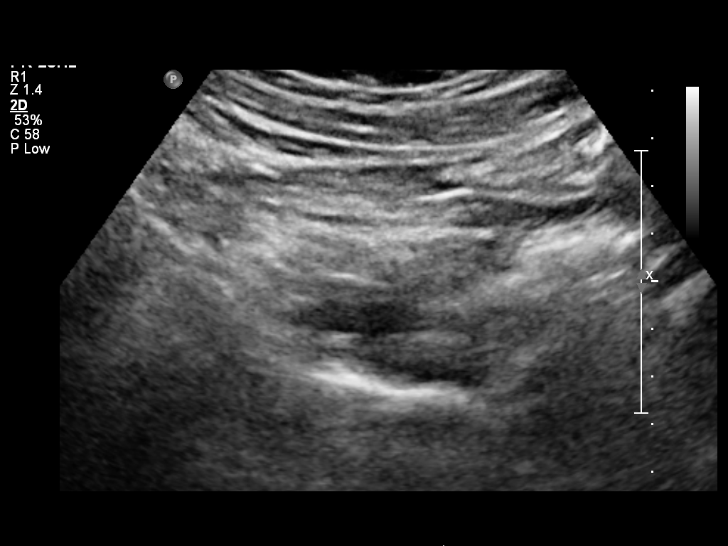
[im 16/46]
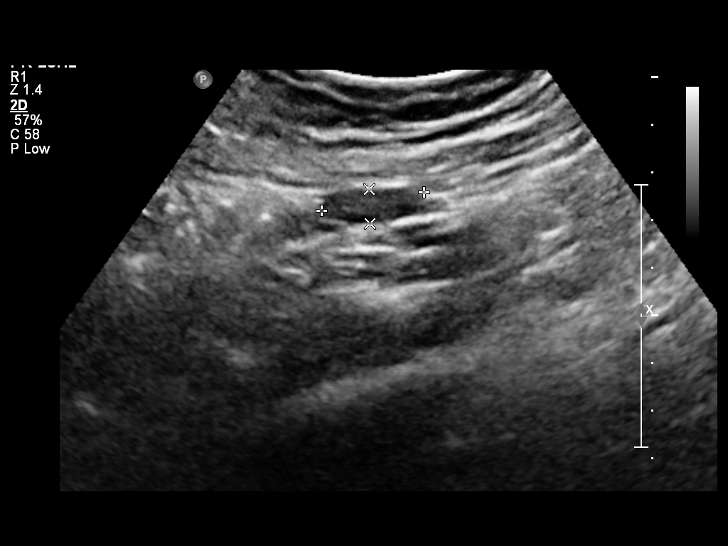
[im 19/46]
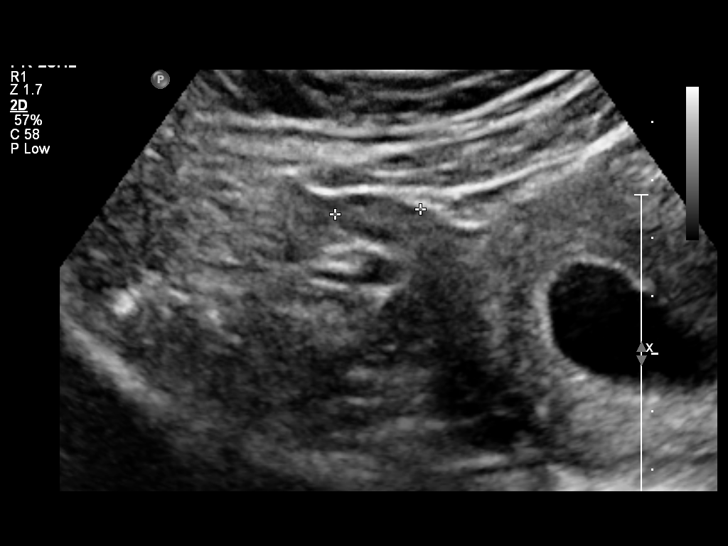
[im 24/46]
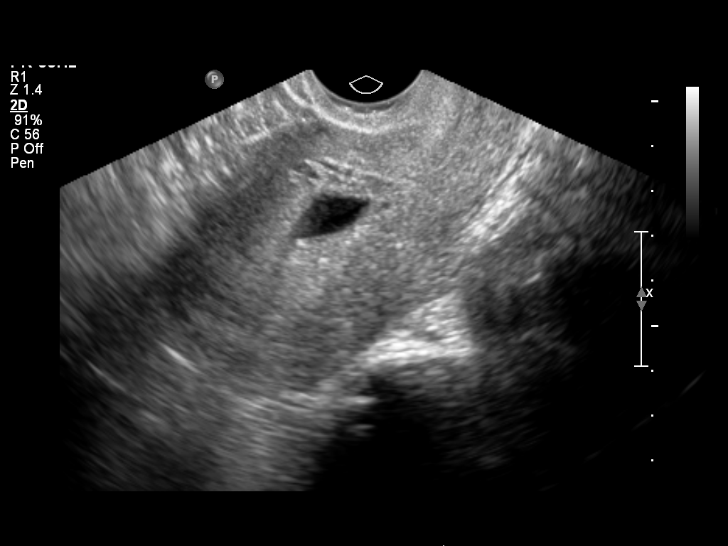
[im 27/46]
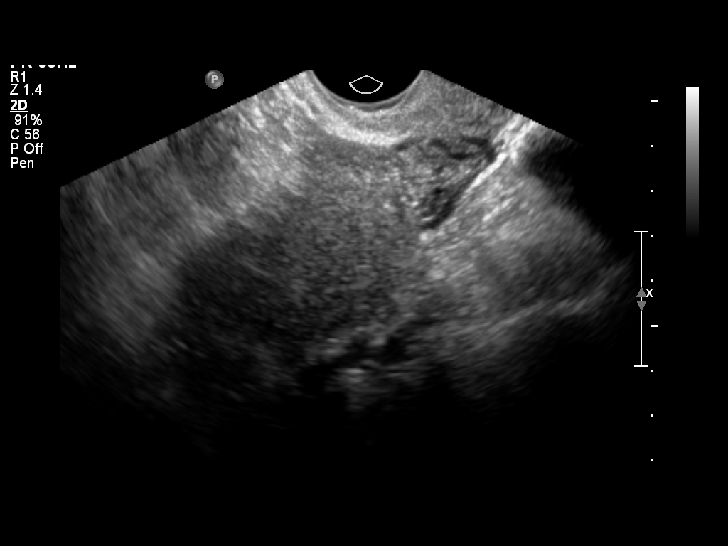
[im 31/46]
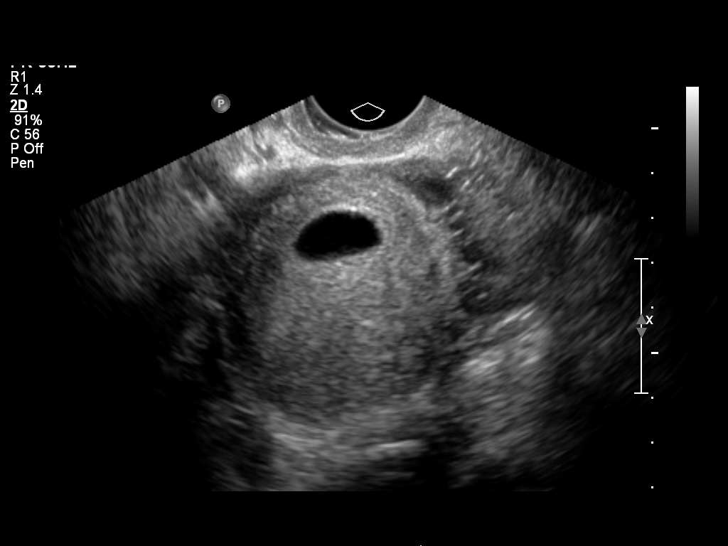
[im 34/46]
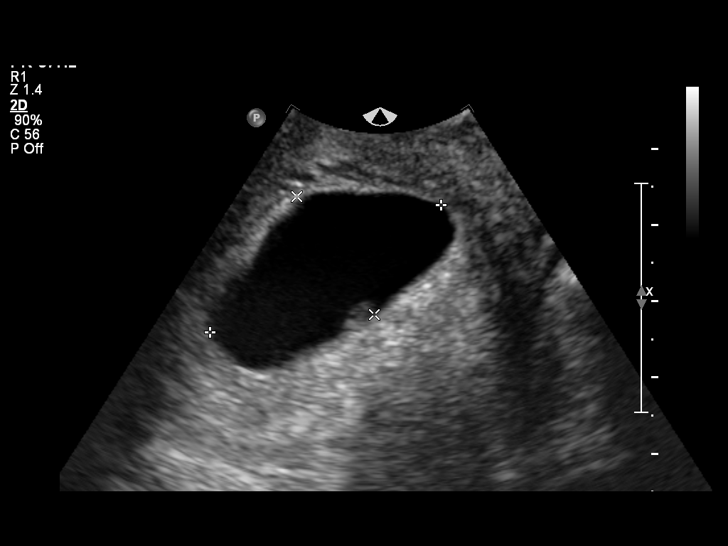
[im 37/46]
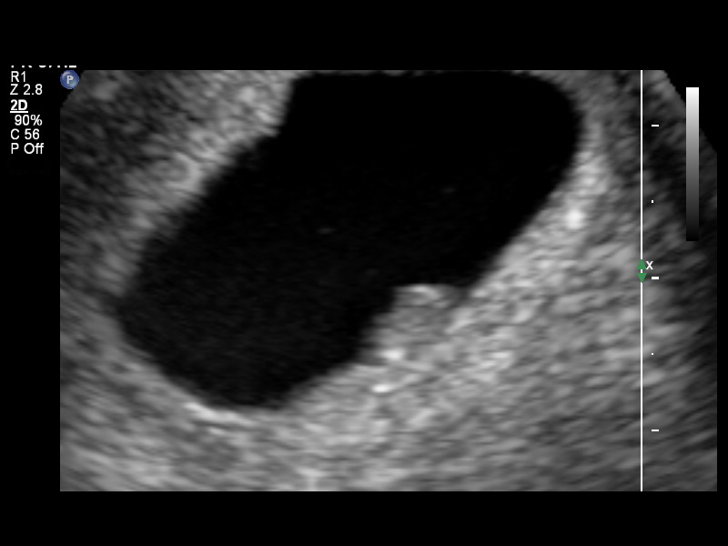
[im 41/46]
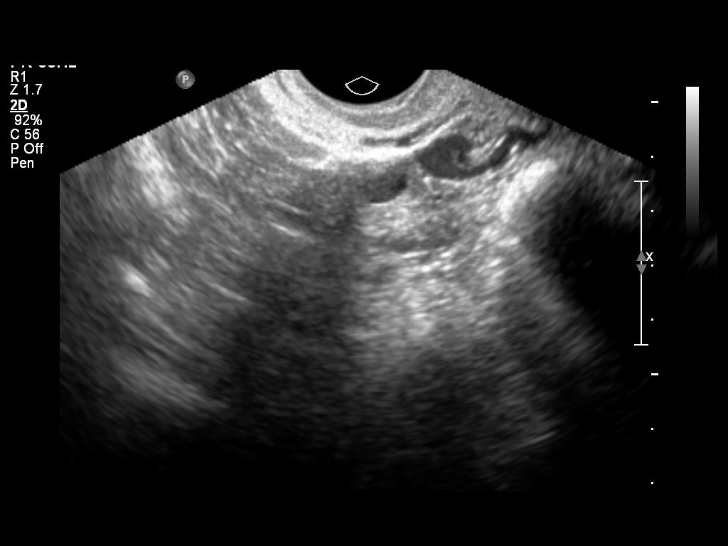
[im 44/46]
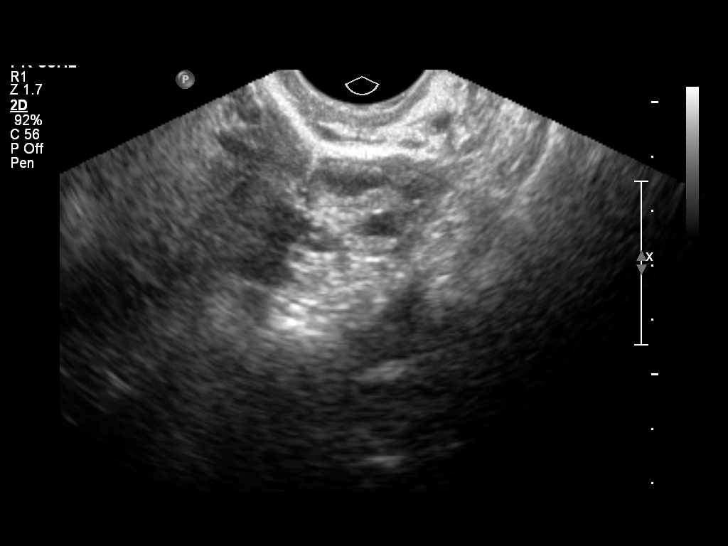

[13 of 28 positions shown; findings below may reference images not displayed]

FINDINGS: Intrauterine gestational sac: A single intrauterine gestational sac
is identified. Sac size appear somewhat large compared to the size
of the fetal pole.

Yolk sac:  Yolk sac is not visualized.

Embryo:  Fetal pole is identified.

Cardiac Activity: Fetal cardiac activity is not identified.

Heart Rate:  0 bpm

MSD:  24.9  mm   7 w   5  d

CRL:   5.3  mm   6 w 3 d                  US EDC: 07/25/2015

Maternal uterus/adnexae: The uterus is anteverted. No myometrial
mass lesions are identified. Small subchorionic hemorrhage is
suggested. Both ovaries are visualized and appear normal. No free
pelvic fluid collections.
IMPRESSION: A single intrauterine gestational sac is identified. The fetal pole
is identified but no fetal cardiac activity is shown. Crown-rump
length measures 5.3 mm consistent with estimated gestational age of
6 weeks 3 days, small compared with reported LMP. Findings are
suspicious but not yet definitive for failed pregnancy. Recommend
follow-up US in 10-14 days for definitive diagnosis. This
recommendation follows SRU consensus guidelines: Diagnostic Criteria
for Nonviable Pregnancy Early in the First Trimester. N Engl J Med

## 2016-12-04 NOTE — L&D Delivery Note (Signed)
Delivery Note At 12:08 AM a viable female was delivered via  (Presentation: ROA).  APGAR: 9, 9; weight  pending.   Placenta status: Spontaneous, intact.  Cord: 3 vessels  Anesthesia:  1% lidocaine for repair Episiotomy: None Lacerations:  2nd degree perineal Suture Repair: 3.0 vicryl Est. Blood Loss (mL):  300  Mom to postpartum.  Baby to Couplet care / Skin to Skin.  Len Blalock SNM 04/23/2017, 12:34 AM

## 2017-02-13 ENCOUNTER — Telehealth (HOSPITAL_COMMUNITY): Payer: Self-pay | Admitting: *Deleted

## 2017-02-13 NOTE — Telephone Encounter (Signed)
Telephoned patient at home number number and left message to return call to [facility]. Used interpreter Lavon Paganini.

## 2017-02-15 ENCOUNTER — Ambulatory Visit (HOSPITAL_COMMUNITY)
Admission: RE | Admit: 2017-02-15 | Discharge: 2017-02-15 | Disposition: A | Discharge status: Home / Self Care | Payer: Self-pay | Source: Ambulatory Visit | Attending: Obstetrics and Gynecology | Admitting: Obstetrics and Gynecology

## 2017-02-15 ENCOUNTER — Encounter (HOSPITAL_COMMUNITY): Payer: Self-pay | Admitting: *Deleted

## 2017-02-15 VITALS — BP 110/68 | Temp 98.6°F | Ht 65.0 in | Wt 164.2 lb

## 2017-02-15 DIAGNOSIS — R87611 Atypical squamous cells cannot exclude high grade squamous intraepithelial lesion on cytologic smear of cervix (ASC-H): Secondary | ICD-10-CM

## 2017-02-15 DIAGNOSIS — Z1239 Encounter for other screening for malignant neoplasm of breast: Secondary | ICD-10-CM

## 2017-02-15 NOTE — Progress Notes (Signed)
Patient referred to [place] by the [facility] Department due to recommending a colposcopy to follow up for abnormal Pap smear completed 02/05/2017.  Pap Smear:  Pap smear not completed today. Last Pap smear was 02/05/2017 at the [facility] Department and ASCUS cannot exclude a high grade. Referred patient to the [facility] for [place] at [facility] for a colposcopy to follow up for her abnormal Pap smear. Appointment scheduled for Tuesday, February 20, 2017 at 1300. Per patient has no history of an abnormal Pap smear prior to her most recent Pap smear. Last Pap smear result is in EPIC.  Physical exam: Breasts Breasts symmetrical. No skin abnormalities bilateral breasts. No nipple retraction bilateral breasts. Patient is currently breastfeeding. No lymphadenopathy. No lumps palpated bilateral breasts. No complaints of pain or tenderness on exam. Screening mammogram recommended at age 54 unless clinically indicated prior.     Pelvic/Bimanual No Pap smear completed today since last Pap smear was 02/05/2017. Pap smear not indicated per BCCCP guidelines.   Smoking History: Patient has never smoked.  Patient Navigation: Patient education provided. Access to services provided for patient through [facility] program. Spanish interpreter provided.  Used Spanish interpreter Lockie Mola from [facility].

## 2017-02-15 NOTE — Patient Instructions (Signed)
Explained breast self awareness with Delisia Beltran-Gallegos. Patient did not need a Pap smear today due to last Pap smear was 02/05/2017. Explained the colposcopy with patient the recommended follow up for her Pap smear. Referred patient to the [facility] [place] at [facility] for a colposcopy to follow up for her abnormal Pap smear. Appointment scheduled for Tuesday, February 20, 2017 at 1300. Patient aware of appointment and will be there. Informed patient that she will need a screening mammogram age 46 unless clinically indicated prior. Naina Beltran-Gallegos verbalized understanding.  Jolonda Gomm, Arvil Chaco, RN 2:34 PM

## 2017-02-16 ENCOUNTER — Encounter (HOSPITAL_COMMUNITY): Payer: Self-pay | Admitting: *Deleted

## 2017-02-20 ENCOUNTER — Encounter: Payer: Self-pay | Admitting: Obstetrics and Gynecology

## 2017-02-20 ENCOUNTER — Ambulatory Visit (INDEPENDENT_AMBULATORY_CARE_PROVIDER_SITE_OTHER): Payer: Self-pay | Admitting: Obstetrics and Gynecology

## 2017-02-20 VITALS — BP 103/75 | HR 118 | Wt 163.2 lb

## 2017-02-20 DIAGNOSIS — R87611 Atypical squamous cells cannot exclude high grade squamous intraepithelial lesion on cytologic smear of cervix (ASC-H): Secondary | ICD-10-CM

## 2017-02-20 NOTE — Progress Notes (Signed)
26 yo G3P2002 at [redacted]w[redacted]d with 02/2017 pap smear of ASCUS cannot exclude high grade here for colposcopy. Patient receives her care at the health department Patient given informed consent, signed copy in the chart, time out was performed.  Placed in lithotomy position. Cervix viewed with speculum and colposcope after application of acetic acid.   Colposcopy adequate?  yes [place]et[place]ite lesions? Yes from 12-8 o'clock Punctation? yes Mosaicism?  no Abnormal vasculature?  no Biopsies? no ECC? no  COMMENTS:  Patient was given post procedure instructions.  She will return for repeat colpo with biopsies 6 weeks postpartum  Mora Bellman, MD

## 2017-04-22 ENCOUNTER — Encounter (HOSPITAL_COMMUNITY): Payer: Self-pay | Admitting: *Deleted

## 2017-04-22 ENCOUNTER — Inpatient Hospital Stay (HOSPITAL_COMMUNITY)
Admission: AD | Admit: 2017-04-22 | Discharge: 2017-04-25 | DRG: 775 | Disposition: A | Discharge status: Home / Self Care | Payer: Medicaid Other | Source: Ambulatory Visit | Attending: Obstetrics & Gynecology | Admitting: Obstetrics & Gynecology

## 2017-04-22 DIAGNOSIS — Z3A39 39 weeks gestation of pregnancy: Secondary | ICD-10-CM | POA: Diagnosis not present

## 2017-04-22 DIAGNOSIS — Z8249 Family history of ischemic heart disease and other diseases of the circulatory system: Secondary | ICD-10-CM

## 2017-04-22 DIAGNOSIS — Z3493 Encounter for supervision of normal pregnancy, unspecified, third trimester: Secondary | ICD-10-CM | POA: Diagnosis present

## 2017-04-22 LAB — CBC
HCT: 37.8 % (ref 36.0–46.0)
Hemoglobin: 12.8 g/dL (ref 12.0–15.0)
MCH: 28.8 pg (ref 26.0–34.0)
MCHC: 33.9 g/dL (ref 30.0–36.0)
MCV: 84.9 fL (ref 78.0–100.0)
PLATELETS: 171 10*3/uL (ref 150–400)
RBC: 4.45 MIL/uL (ref 3.87–5.11)
RDW: 13.1 % (ref 11.5–15.5)
WBC: 9.7 10*3/uL (ref 4.0–10.5)

## 2017-04-22 LAB — TYPE AND SCREEN
ABO/RH(D): O POS
Antibody Screen: NEGATIVE

## 2017-04-22 MED ORDER — OXYTOCIN BOLUS FROM INFUSION
500.0000 mL | Freq: Once | INTRAVENOUS | Status: AC
  Administered 2017-04-23: 500 mL/h via INTRAVENOUS

## 2017-04-22 MED ORDER — OXYCODONE-ACETAMINOPHEN 5-325 MG PO TABS: 2.0000 | ORAL_TABLET | ORAL | Status: DC | PRN

## 2017-04-22 MED ORDER — FLEET ENEMA 7-19 GM/118ML RE ENEM: 1.0000 | ENEMA | RECTAL | Status: DC | PRN

## 2017-04-22 MED ORDER — OXYCODONE-ACETAMINOPHEN 5-325 MG PO TABS: 1.0000 | ORAL_TABLET | ORAL | Status: DC | PRN

## 2017-04-22 MED ORDER — LACTATED RINGERS IV SOLN: 500.0000 mL | INTRAVENOUS | Status: DC | PRN

## 2017-04-22 MED ORDER — SOD CITRATE-CITRIC ACID 500-334 MG/5ML PO SOLN: 30.0000 mL | ORAL | Status: DC | PRN

## 2017-04-22 MED ORDER — ACETAMINOPHEN 325 MG PO TABS: 650.0000 mg | ORAL_TABLET | ORAL | Status: DC | PRN

## 2017-04-22 MED ORDER — FENTANYL CITRATE (PF) 100 MCG/2ML IJ SOLN: 50.0000 ug | INTRAMUSCULAR | Status: DC | PRN

## 2017-04-22 MED ORDER — OXYTOCIN 40 UNITS IN LACTATED RINGERS INFUSION - SIMPLE MED
2.5000 [IU]/h | INTRAVENOUS | Status: DC
  Filled 2017-04-22: qty 1000

## 2017-04-22 MED ORDER — ONDANSETRON HCL 4 MG/2ML IJ SOLN: 4.0000 mg | Freq: Four times a day (QID) | INTRAMUSCULAR | Status: DC | PRN

## 2017-04-22 MED ORDER — LIDOCAINE HCL (PF) 1 % IJ SOLN
30.0000 mL | INTRAMUSCULAR | Status: DC | PRN
  Administered 2017-04-23: 30 mL via SUBCUTANEOUS
  Filled 2017-04-22: qty 30

## 2017-04-22 MED ORDER — LACTATED RINGERS IV SOLN
INTRAVENOUS | Status: DC
  Administered 2017-04-22: 23:00:00 via INTRAVENOUS

## 2017-04-22 NOTE — Progress Notes (Signed)
Subjective: Patient feeling increase in pressure, urge to push  Objective: Vitals:   04/22/17 2232 04/22/17 2321  BP: 117/76   Pulse: 89 100  Resp: 18 20  Temp: 98.5 F (36.9 C) 98.7 F (37.1 C)  TempSrc: Oral Oral  SpO2: 100%   Weight:  174 lb (78.9 kg)  Height:  5\' 6"  (1.676 m)   No intake/output data recorded.  FHT:  FHR: 140 bpm, variability: moderate,  accelerations:  Present,  decelerations:  Absent UC:   regular, every 2-3 minutes SVE:   Dilation: 9 Effacement (%): 90 Station: 0 Exam by:: Sharolyn Douglas CNM  Labs: Lab Results  Component Value Date   WBC 9.7 04/22/2017   HGB 12.8 04/22/2017   HCT 37.8 04/22/2017   MCV 84.9 04/22/2017   PLT 171 04/22/2017    Assessment / Plan: IUP at term. SOL. GBS unknown.  AROM with copious amount of clear fluid. Patient felt some relief of pressure.  Plan: Anticipate SVD soon  Len Blalock SNM 04/22/2017, 11:45 PM

## 2017-04-22 NOTE — MAU Note (Signed)
Pt reports contractions ever 8-9 mins. Denies LOF. Has some spotting when wiping. +FM

## 2017-04-22 NOTE — H&P (Signed)
Kim Sanders is a 26 y.o. female Y4M2500 at 39.6 weeks presenting for spontaneous onset of labor. States contractions started at 1600 and are every 2-3 minutes. She denies leaking of fluid and reports pink discharge when she wipes. Reports good fetal movement. Patient stopped PNC at 29 weeks, states she "missed all of my appointments." 2 appointments at [facility].   OB History    Gravida Para Term Preterm AB Living   5 2 2   2 2    SAB TAB Ectopic Multiple Live Births   2     0 2     Past Medical History:  Diagnosis Date  . Medical history non-contributory    Past Surgical History:  Procedure Laterality Date  . DILATION AND CURETTAGE OF UTERUS     Family History: family history includes Hypertension in her father. Social History:  reports that she has never smoked. She has never used smokeless tobacco. She reports that she does not drink alcohol or use drugs.     Maternal Diabetes: unknown Genetic Screening: not done Maternal Ultrasounds/Referrals: Normal Fetal Ultrasounds or other Referrals:  None Maternal Substance Abuse:  No Significant Maternal Medications:  None Significant Maternal Lab Results:  None  Unknown GBS status, but no risk factors per CDC standards Other Comments:  None  Review of Systems  Constitutional: Negative.   Respiratory: Negative.  Negative for shortness of breath.   Cardiovascular: Negative.  Negative for chest pain.  Gastrointestinal: Positive for abdominal pain.   Maternal Medical History:  Reason for admission: Contractions.   Contractions: Onset was 6-12 hours ago.   Frequency: regular.   Duration is approximately 60 seconds.   Perceived severity is moderate.    Fetal activity: Perceived fetal activity is normal.   Last perceived fetal movement was within the past hour.      Dilation: 8.5 Effacement (%): 100 Station: -2 Exam by:: K. WeissRN Blood pressure 117/76, pulse 89, temperature 98.5 F (36.9 C), temperature source Oral,  resp. rate 18, SpO2 100 %, unknown if currently breastfeeding. Maternal Exam:  Uterine Assessment: Contraction strength is moderate.  Contraction duration is 60 seconds. Contraction frequency is regular.   Abdomen: Patient reports no abdominal tenderness. Fetal presentation: vertex  Introitus: Normal vulva. Normal vagina.  Ferning test: not done.  Nitrazine test: not done. Amniotic fluid character: not assessed.  Pelvis: adequate for delivery.   Cervix: Cervix evaluated by digital exam.     Fetal Exam Fetal Monitor Review: Mode: ultrasound.   Baseline rate: 140.  Variability: moderate (6-25 bpm).   Pattern: accelerations present and no decelerations.    Fetal State Assessment: Category I - tracings are normal.     Physical Exam  Nursing note and vitals reviewed. Constitutional: She appears well-developed and well-nourished.  HENT:  Head: Normocephalic and atraumatic.  Eyes: Conjunctivae are normal. No scleral icterus.  Respiratory: Effort normal. No respiratory distress.  GI: Soft. There is no tenderness.  Musculoskeletal: Normal range of motion.  Neurological: She is alert.  Skin: Skin is warm and dry.  Psychiatric: She has a normal mood and affect. Her behavior is normal. Judgment and thought content normal.    Prenatal labs: ABO, Rh:  O Pos Antibody:  Neg Rubella:   RPR:   Negative HBsAg:    HIV:    GBS:   unknown Some labs in prenatal record  Assessment/Plan: IUP at term. Spontaneous onset of labor. GBS unknown.   Plan: Manage expectantly. Not treating for unknown GBS per risk factor criteria.  OB panel ordered due to limited prenatal care. Anticipate NSVD  Len Blalock SNM 04/22/2017, 10:53 PM  The patient was seen and examined by me also Agree with note NST reactive and reassuring UCs as listed Cervical exams as listed in note  Seabron Spates, CNM

## 2017-04-23 ENCOUNTER — Encounter (HOSPITAL_COMMUNITY): Payer: Self-pay | Admitting: General Practice

## 2017-04-23 DIAGNOSIS — Z3A39 39 weeks gestation of pregnancy: Secondary | ICD-10-CM

## 2017-04-23 LAB — HEPATITIS B SURFACE ANTIGEN: HEP B S AG: NEGATIVE

## 2017-04-23 LAB — HIV ANTIBODY (ROUTINE TESTING W REFLEX): HIV Screen 4th Generation wRfx: NONREACTIVE

## 2017-04-23 LAB — RPR: RPR: NONREACTIVE

## 2017-04-23 MED ORDER — ACETAMINOPHEN 325 MG PO TABS
650.0000 mg | ORAL_TABLET | ORAL | Status: DC | PRN
  Administered 2017-04-24: 650 mg via ORAL
  Filled 2017-04-23: qty 2

## 2017-04-23 MED ORDER — SENNOSIDES-DOCUSATE SODIUM 8.6-50 MG PO TABS
2.0000 | ORAL_TABLET | ORAL | Status: DC
  Administered 2017-04-23 – 2017-04-24 (×2): 2 via ORAL
  Filled 2017-04-23 (×2): qty 2

## 2017-04-23 MED ORDER — SIMETHICONE 80 MG PO CHEW: 80.0000 mg | CHEWABLE_TABLET | ORAL | Status: DC | PRN

## 2017-04-23 MED ORDER — TETANUS-DIPHTH-ACELL PERTUSSIS 5-2.5-18.5 LF-MCG/0.5 IM SUSP: 0.5000 mL | Freq: Once | INTRAMUSCULAR | Status: DC

## 2017-04-23 MED ORDER — ONDANSETRON HCL 4 MG/2ML IJ SOLN: 4.0000 mg | INTRAMUSCULAR | Status: DC | PRN

## 2017-04-23 MED ORDER — IBUPROFEN 600 MG PO TABS
600.0000 mg | ORAL_TABLET | Freq: Four times a day (QID) | ORAL | Status: DC
  Administered 2017-04-23 – 2017-04-25 (×10): 600 mg via ORAL
  Filled 2017-04-23 (×10): qty 1

## 2017-04-23 MED ORDER — WITCH HAZEL-GLYCERIN EX PADS: 1.0000 "application " | MEDICATED_PAD | CUTANEOUS | Status: DC | PRN

## 2017-04-23 MED ORDER — COCONUT OIL OIL: 1.0000 "application " | TOPICAL_OIL | Status: DC | PRN

## 2017-04-23 MED ORDER — METHYLERGONOVINE MALEATE 0.2 MG PO TABS: 0.2000 mg | ORAL_TABLET | ORAL | Status: DC | PRN

## 2017-04-23 MED ORDER — PRENATAL MULTIVITAMIN CH
1.0000 | ORAL_TABLET | Freq: Every day | ORAL | Status: DC
  Administered 2017-04-23 – 2017-04-25 (×2): 1 via ORAL
  Filled 2017-04-23 (×3): qty 1

## 2017-04-23 MED ORDER — OXYCODONE HCL 5 MG PO TABS: 5.0000 mg | ORAL_TABLET | ORAL | Status: DC | PRN

## 2017-04-23 MED ORDER — ZOLPIDEM TARTRATE 5 MG PO TABS: 5.0000 mg | ORAL_TABLET | Freq: Every evening | ORAL | Status: DC | PRN

## 2017-04-23 MED ORDER — DIBUCAINE 1 % RE OINT: 1.0000 "application " | TOPICAL_OINTMENT | RECTAL | Status: DC | PRN

## 2017-04-23 MED ORDER — DIPHENHYDRAMINE HCL 25 MG PO CAPS: 25.0000 mg | ORAL_CAPSULE | Freq: Four times a day (QID) | ORAL | Status: DC | PRN

## 2017-04-23 MED ORDER — MEASLES, MUMPS & RUBELLA VAC ~~LOC~~ INJ
0.5000 mL | INJECTION | Freq: Once | SUBCUTANEOUS | Status: DC
  Filled 2017-04-23: qty 0.5

## 2017-04-23 MED ORDER — METHYLERGONOVINE MALEATE 0.2 MG/ML IJ SOLN: 0.2000 mg | INTRAMUSCULAR | Status: DC | PRN

## 2017-04-23 MED ORDER — OXYCODONE HCL 5 MG PO TABS
10.0000 mg | ORAL_TABLET | ORAL | Status: DC | PRN
  Filled 2017-04-23: qty 2

## 2017-04-23 MED ORDER — BENZOCAINE-MENTHOL 20-0.5 % EX AERO: 1.0000 "application " | INHALATION_SPRAY | CUTANEOUS | Status: DC | PRN

## 2017-04-23 MED ORDER — ONDANSETRON HCL 4 MG PO TABS: 4.0000 mg | ORAL_TABLET | ORAL | Status: DC | PRN

## 2017-04-23 NOTE — Progress Notes (Signed)
Checked on patient need , ordered meals, by Juliann Mule Spanish Interpreter

## 2017-04-23 NOTE — Lactation Note (Signed)
This note was copied from a baby's chart. Lactation Consultation Note  Patient Name: Kim Sanders [id number] Date: 04/23/2017 Reason for consult: Initial assessment   Initial assessment with Exp BF mom of 59 hour old infant. Spoke with mom via hospital interpreter, Kim Sanders.   Infant asleep in crib, mom eating lunch. Mom reports she BF her oldest child for 3 months and her 26 yo for 8 months. Mom reports she plans to BF and formula feed infant. Mom reports she usually starts formula in the hospital. Reviewed supply and demand and enc mom to BF 8-12 x in 24 hours at first feeding cues. Enc mom to offer breast prior to formula to promote a good milk supply. Mom reports she knows how to hand express, enc mom to hand express before latch and after latch. Enc mom to offer infant supplement via spoon as available.   Infant with 4 BF for 10-20 minutes, 3 attempts, 2 voids and 2 stools since birth. LATCH Scores 8-10.   BF Resources Handout and [place] Brochure given, mom informed of IP/OP Services, BF Support Groups and [place] phone #. Enc mom to call out for assistance as needed. Mom reports she has no questions/concerns at this time. She reports she does not feel she needs LC assistance at this time.     Maternal Data Formula Feeding for Exclusion: Yes Reason for exclusion: Mother's choice to formula and breast feed on admission Has patient been taught Hand Expression?: Yes Does the patient have breastfeeding experience prior to this delivery?: Yes  Feeding    LATCH Score/Interventions                      Lactation Tools Discussed/Used WIC Program: No   Consult Status Consult Status: Follow-up Date: 04/24/17 Follow-up type: In-patient    Kim Sanders Kim Sanders 04/23/2017, 4:06 PM

## 2017-04-24 NOTE — Progress Notes (Signed)
POSTPARTUM PROGRESS NOTE  Post Partum Day #1  Subjective:  Kim Sanders is a 26 y.o. H4K3524 [redacted]w[redacted]d s/p NSVD.  No acute events overnight.  Pt denies problems with ambulating, voiding or po intake.  She denies nausea or vomiting.  Pain is well controlled.  She has had flatus. She has had bowel movement.  Lochia Small.   Objective: Blood pressure 119/68, pulse 88, temperature 97.8 F (36.6 C), temperature source Oral, resp. rate 18, height 5\' 6"  (8.185 m), weight 174 lb (78.9 kg), SpO2 98 %, unknown if currently breastfeeding.  Physical Exam:  General: alert, cooperative and no distress Lochia:normal flow Chest: CTAB Heart: RRR no m/r/g Abdomen: soft, nontender Uterine Fundus: firm, below umbilicus DVT Evaluation: No calf swelling or tenderness Extremities: No edema   Recent Labs  04/22/17 2257  HGB 12.8  HCT 37.8    Assessment/Plan:  ASSESSMENT: Kim Sanders is a 26 y.o. T0B3112 [redacted]w[redacted]d s/p NSVD  Consider planning for discharge home tomorrow   LOS: 2 days   Everrett Coombe, MD PGY-1 [facility]  04/24/2017, 7:21 AM

## 2017-04-24 NOTE — Progress Notes (Signed)
LCSW consulted for late prenatal care. Per policy patient does not meet criteria for assessment as she started prenatal care at 27 weeks. LCSW has reviewed chart and no other concerns noted in prenatal record.  If needs arise, please re-consult.  Lane Hacker, MSW Clinical Social Work: Printmaker Coverage for :  [phone]

## 2017-04-25 MED ORDER — IBUPROFEN 600 MG PO TABS: 600.0000 mg | ORAL_TABLET | Freq: Four times a day (QID) | ORAL | 0 refills | Status: DC

## 2017-04-25 NOTE — Discharge Summary (Signed)
OB Discharge Summary     Patient Name: Kim Sanders DOB: [birth year] MRN: [id number]  Date of admission: 04/22/2017 Delivering MD: Wende Mott   Date of discharge: 04/25/2017  Admitting diagnosis: 57 WKS, CTXS Intrauterine pregnancy: [redacted]w[redacted]d     Secondary diagnosis:  Active Problems:   Normal labor  Additional problems: Baby with jaundice, under bili lights     Discharge diagnosis: Term Pregnancy Delivered                                                                                                Post partum procedures:n/a  Augmentation: n/a  Complications: None  Hospital course:  Onset of Labor With Vaginal Delivery     26 y.o. yo [id number] at [redacted]w[redacted]d was admitted in Active Labor on 04/22/2017. Patient had an uncomplicated labor course as follows:  Membrane Rupture Time/Date: 11:43 PM ,04/22/2017   Intrapartum Procedures: Episiotomy: None [1]                                         Lacerations:  2nd degree [3];Perineal [11]  Patient had a delivery of a Viable infant. 04/23/2017  Information for the patient's newborn:  Kim Sanders [[id number]]  Delivery Method: Vaginal, Spontaneous Delivery (Filed from Delivery Summary)    Pateint had an uncomplicated postpartum course.  She is ambulating, tolerating a regular diet, passing flatus, and urinating well. Patient is discharged home in stable condition on 04/25/17.   Physical exam  Vitals:   04/23/17 1906 04/24/17 0549 04/24/17 1847 04/25/17 0628  BP: 112/64 119/68 115/71 102/73  Pulse: 86 88 67 74  Resp: 18 18 17 18   Temp: 98 F (36.7 C) 97.8 F (36.6 C) 98 F (36.7 C) 97.9 F (36.6 C)  TempSrc: Oral Oral Oral Oral  SpO2:      Weight:      Height:       General: alert Lochia: appropriate Uterine Fundus: firm Incision: N/A DVT Evaluation: No evidence of DVT seen on physical exam. Negative Homan's sign. No cords or calf tenderness. No significant calf/ankle edema. Labs: Lab Results   Component Value Date   WBC 9.7 04/22/2017   HGB 12.8 04/22/2017   HCT 37.8 04/22/2017   MCV 84.9 04/22/2017   PLT 171 04/22/2017   CMP Latest Ref Rng & Units 12/07/2014  Glucose 70 - 99 mg/dL 99  BUN 6 - 23 mg/dL 13  Creatinine 0.50 - 1.10 mg/dL 0.79  Sodium 135 - 145 mmol/L 141  Potassium 3.5 - 5.1 mmol/L 3.7  Chloride 96 - 112 mEq/L 105  CO2 19 - 32 mmol/L 27  Calcium 8.4 - 10.5 mg/dL 9.7    Discharge instruction: per After Visit Summary and "Baby and Me Booklet".  After visit meds:  Allergies as of 04/25/2017   No Known Allergies     Medication List    TAKE these medications   ibuprofen 600 MG tablet Commonly known as:  ADVIL,MOTRIN Take 1 tablet (600 mg total) by mouth every  6 (six) hours.   multivitamin-prenatal 27-0.8 MG Tabs tablet Take 1 tablet by mouth daily at 12 noon.       Diet: routine diet  Activity: Advance as tolerated. Pelvic rest for 6 weeks.   Outpatient follow up:6 weeks Follow up Appt:No future appointments. Follow up Visit:No Follow-up on file.  Postpartum contraception: Depo Provera  Newborn Data: Live born female  Birth Weight: 8 lb 1.1 oz (3660 g) APGAR: 9, 9  Baby Feeding: Breast Disposition:home with mother   04/25/2017 [name], CNM

## 2017-04-25 NOTE — Discharge Instructions (Signed)
Parto vaginal, cuidados posteriores  (Vaginal Delivery, Care After)  Siga estas instrucciones durante las próximas semanas. Estas indicaciones le proporcionan información acerca de cómo deberá cuidarse después del parto. El médico también podrá darle instrucciones más específicas. El tratamiento ha sido planificado según las prácticas médicas actuales, pero en algunos casos pueden ocurrir problemas. Llame al médico si tiene problemas o preguntas.  QUÉ ESPERAR DESPUÉS DEL PARTO  Después de un parto vaginal, es frecuente tener lo siguiente:  · Hemorragia leve de la vagina.  · Dolor en el abdomen, la vagina y la zona de la piel entre la abertura vaginal y el ano (perineo).  · Calambres pélvicos.  · Fatiga.  INSTRUCCIONES PARA EL CUIDADO EN EL HOGAR  Medicamentos  · Tome los medicamentos de venta libre y los recetados solamente como se lo haya indicado el médico.  · Si le recetaron un antibiótico, tómelo como se lo haya indicado el médico. No interrumpa la administración del antibiótico hasta que lo haya terminado.  Conducir  · No conduzca ni opere maquinaria pesada mientras toma analgésicos recetados.  · No conduzca durante 24 horas si le administraron un sedante.  Estilo de vida  · No beba alcohol. Esto es de suma importancia si está amamantando o toma analgésicos.  · No consuma productos que contengan tabaco, incluidos cigarrillos, tabaco de mascar o cigarrillos electrónicos. Si necesita ayuda para dejar de fumar, consulte al médico.  Comida y bebida  · Beba al menos 8 vasos de 8 onzas (240 cc) de agua todos los días a menos que el médico le indique lo contrario. Si elige amamantar al bebé, quizá deba beber aún más cantidad de agua.  · Ingiera alimentos ricos en fibras todos los días. Estos alimentos pueden ayudarla a prevenir o aliviar el estreñimiento. Los alimentos ricos en fibras incluyen, entre otros:  ? Panes y cereales integrales.  ? Arroz integral.  ? Frijoles.  ? Frutas y verduras  frescas.  Actividad  · Reanude sus actividades normales como se lo haya indicado el médico. Pregúntele al médico qué actividades son seguras para usted.  · Descanse todo lo que pueda. Trate de descansar o tomar una siesta mientras el bebé está durmiendo.  · No levante objetos que pesen más de 10 libras (4,5 kg) hasta que el médico le diga que es seguro hacerlo.  · Hable con el médico sobre cuándo puede volver a tener relaciones sexuales. Esto puede depender de lo siguiente:  ? Riesgo de sufrir infecciones.  ? Velocidad de cicatrización.  ? Comodidad y deseo de tener relaciones sexuales.  Cuidados vaginales  · Si le realizaron una episiotomía o tuvo un desgarro vaginal, contrólese la zona todos los días para detectar signos de infección. Esté atenta a los siguientes signos:  ? Aumento del enrojecimiento, la hinchazón o el dolor.  ? Más líquido o sangre.  ? Calor.  ? Pus o mal olor.  · No use tampones ni se haga duchas vaginales hasta que el médico la autorice.  · Controle la sangre que elimina por la vagina para detectar coágulos. Pueden tener el aspecto de grumos de color rojo oscuro, marrón o negro.  Instrucciones generales  · Mantenga el perineo limpio y seco, como se lo haya indicado el médico.  · Use ropa cómoda y suelta.  · Cuando vaya al baño, siempre higienícese de adelante hacia atrás.  · Pregúntele al médico si puede ducharse o tomar baños de inmersión. Si se le realizó una episiotomía o tuvo un desgarro perineal durante el trabajo del parto o   el parto, es posible que el médico le indique que no tome baños de inmersión durante un determinado tiempo.  · Use un sostén que sujete y ajuste bien sus pechos.  · Si es posible, pídale a alguien que la ayude con las tareas del hogar y a cuidar del bebé durante al menos algunos días después de salir del hospital.  · Concurra a todas las visitas de seguimiento para usted y el bebé, como se lo haya indicado el médico. Esto es importante.  SOLICITE ATENCIÓN MÉDICA  SI:  · Tiene los siguientes síntomas:  ? Secreción vaginal que tiene mal olor.  ? Dificultad para orinar.  ? Dolor al orinar.  ? Aumento o disminución repentinos de la frecuencia con que defeca.  ? Más enrojecimiento, hinchazón o dolor alrededor de la episiotomía o del desgarro vaginal.  ? Más secreción de líquido o sangre de la episiotomía o desgarro vaginal.  ? Pus o mal olor proveniente de la episiotomía o el desgarro vaginal.  ? Fiebre.  ? Erupción cutánea.  ? Poco interés o falta de interés en actividades que solían gustarle.  ? Dudas sobre su cuidado y el del bebé.  · Siente la episiotomía o el desgarro vaginal caliente al tacto.  · La episiotomía o el desgarro vaginal se está abriendo o no parece cicatrizar.  · Siente dolor en las mamas, o están duras o enrojecidas.  · Siente tristeza o preocupación de forma inusual.  · Siente náuseas o vomita.  · Elimina coágulos grandes por la vagina. Si expulsa un coágulo sanguíneo por la vagina, guárdelo para mostrárselo a su médico. No tire la cadena sin que el médico examine el coágulo antes.  · Orina más de lo habitual.  · Se siente mareada o se desmaya.  · No ha amamantado para nada y no ha tenido un período menstrual durante 12 semanas después del parto.  · Dejó de amamantar al bebé y no ha tenido su período menstrual durante 12 semanas después de dejar de amamantar.    SOLICITE ATENCIÓN MÉDICA DE INMEDIATO SI:  · Tiene los siguientes síntomas:  ? Dolor que no desaparece o no mejora con el medicamento.  ? Dolor en el pecho.  ? Dificultad para respirar.  ? Visión borrosa o manchas en la vista.  ? Pensamientos de autolesionarse o lesionar al bebé.  · Comienza a sentir dolor en el abdomen o en una de las piernas.  · Dolor de cabeza intenso.  · Se desmaya.  · Tiene una hemorragia tan intensa de la vagina que empapa dos toallitas sanitarias en una hora.    Esta información no tiene como fin reemplazar el consejo del médico. Asegúrese de hacerle al médico cualquier  pregunta que tenga.  Document Released: 11/20/2005 Document Revised: 03/13/2016 Document Reviewed: 12/05/2015  Elsevier Interactive Patient Education © 2017 Elsevier Inc.

## 2017-04-25 NOTE — Lactation Note (Signed)
This note was copied from a baby's chart. Lactation Consultation Note  Patient Name: Kim Sanders [id number] Date: 04/25/2017 Reason for consult: Follow-up assessment Baby at 64hr of life. Upon entry baby was under the UV light and cueing. Mom was agreeable to latch even though she had pumped 5 minutes earlier. Baby latched easily and eagerly. Mom has filling breast that she reports do soften with pumping or bf. Encouraged mom to drain breast frequently. Given extra bottles and labels. Baby may be d/c tonight. Mom has Harmony to take home. Mom denies breast or nipple pain. She is aware of lactation services and support group.   Maternal Data    Feeding Feeding Type: Breast Fed Length of feed: 15 min  LATCH Score/Interventions Latch: Grasps breast easily, tongue down, lips flanged, rhythmical sucking.  Audible Swallowing: Spontaneous and intermittent Intervention(s): Hand expression  Type of Nipple: Everted at rest and after stimulation  Comfort (Breast/Nipple): Filling, red/small blisters or bruises, mild/mod discomfort  Problem noted: Filling Interventions (Filling): Massage;Frequent nursing;Hand pump  Hold (Positioning): No assistance needed to correctly position infant at breast.  LATCH Score: 9  Lactation Tools Discussed/Used     Consult Status Consult Status: Follow-up Date: 04/26/17 Follow-up type: In-patient    Denzil Hughes 04/25/2017, 4:11 PM

## 2017-04-26 LAB — RUBELLA SCREEN: RUBELLA: 6.92 {index} (ref >0.99)

## 2017-10-01 ENCOUNTER — Encounter (HOSPITAL_COMMUNITY): Payer: Self-pay | Admitting: *Deleted

## 2017-10-16 ENCOUNTER — Ambulatory Visit (HOSPITAL_COMMUNITY): Payer: Self-pay

## 2017-11-14 ENCOUNTER — Ambulatory Visit (INDEPENDENT_AMBULATORY_CARE_PROVIDER_SITE_OTHER): Payer: Self-pay | Admitting: Obstetrics and Gynecology

## 2017-11-14 ENCOUNTER — Other Ambulatory Visit (HOSPITAL_COMMUNITY)
Admission: RE | Admit: 2017-11-14 | Discharge: 2017-11-14 | Disposition: A | Discharge status: Home / Self Care | Payer: Self-pay | Source: Ambulatory Visit | Attending: Obstetrics and Gynecology | Admitting: Obstetrics and Gynecology

## 2017-11-14 ENCOUNTER — Encounter: Payer: Self-pay | Admitting: Obstetrics and Gynecology

## 2017-11-14 VITALS — BP 114/75 | HR 79 | Wt 153.9 lb

## 2017-11-14 DIAGNOSIS — Z3202 Encounter for pregnancy test, result negative: Secondary | ICD-10-CM

## 2017-11-14 DIAGNOSIS — R87613 High grade squamous intraepithelial lesion on cytologic smear of cervix (HGSIL): Secondary | ICD-10-CM

## 2017-11-14 LAB — POCT PREGNANCY, URINE: Preg Test, Ur: NEGATIVE

## 2017-11-14 NOTE — Progress Notes (Signed)
[place]

## 2017-11-14 NOTE — Patient Instructions (Signed)
Conizacin cervical (Conization of the Cervix) La conizacin es el corte (escisin) de una porcin del cuello del tero en forma de cono. El procedimiento se realiza a travs de la vagina, en el consultorio mdico o en una sala de operaciones. Este procedimiento se realiza generalmente cuando hay una hemorragia anormal que proviene del cuello del tero. Tambin se realiza para evaluar un Papanicolau anormal o anormalidades observadas en el cuello durante un examen. El tejido se examina bajo para determinar si hay clulas precancerosas o cncer. Este procedimiento no se Metallurgist periodo menstrual o Water quality scientist. INFORME A SU MDICO:  Cualquier alergia que tenga.  Todos los [employer] [place], incluyendo vitaminas, hierbas, gotas oftlmicas, cremas y medicamentos de venta libre.  Problemas previos que usted o los [employer] de su familia hayan tenido con el uso de anestsicos.  Enfermedades de [employer].  Cirugas previas.  Padecimientos mdicos.  Su hbito de fumar.  Si existe un posible embarazo.  RIESGOS Y COMPLICACIONES Generalmente, la conizacin del cuello uterino es un procedimiento seguro. Sin embargo, Games developer procedimiento, pueden surgir complicaciones. Las complicaciones posibles son:  Retail buyer un sangrado durante [employer] o semanas despus del procedimiento. Un ligero sangrado o manchado luego del procedimiento es normal.  Infeccin (raro).  Lesin en el cuello uterino o en los rganos que lo rodean (poco frecuente).  Problemas con la anestesia.  Aumento del riesgo de parto prematuro en futuros [employer]. ANTES DEL PROCEDIMIENTO  No debe comer ni beber nada durante las 6 - 8 horas previas al examen.  No tome aspirina ni anticoagulantes durante la semana previa al procedimiento, o segn le hayan indicado.  Pdale a alguna persona que la lleve a su casa luego del procedimiento.  PROCEDIMIENTO Hay tres mtodos diferentes para Customer service manager  conizacin del cuello del tero. Ellos son:  Mtodo con bistur fro: es este mtodo se corta una pequea muestra de tejido con forma de cono con un bistur (escalpelo) en el canal cervical y en la zona de transformacin (dnde terminan las clulas normales y [place] anormales).  El mtodo LEEP: en este mtodo se corta una pequea muestra de tejido en forma de cono con un cable delgado que quema (cauteriza) el tejido del cuello uterino con corriente Art therapist.  Tratamiento con lser: en este mtodo se corta una pequea muestra de tejido en forma de cono y luego se cauteriza con un rayo lser para evitar el sangrado. El procedimiento se realizar como sigue:  Segn el mtodo, le administrarn medicamentos para Nature conservation officer dormir (anestesia general) o medicamentos para adormecer la zona (anestesia local). Podrn administrarle un medicamento para adormecer el cuello del tero (boqueo cervical).  Se inserta un dispositivo lubricado llamado espculo dentro de la vagina para separar las paredes. Esto ayudar a que el mdico pueda ver mejor el interior de la vagina y el cuello del tero.  El tejido se extirpar y ser examinado.  Los resultados sel procedimiento ayudarn al mdico a decidir si es Public house manager. Tambin ayudar al mdico a decidir el mejor tratamiento, si el resultado no es normal. DESPUS DEL PROCEDIMIENTO  Si le han administrado anestesia general se sentir mareada durante 2-3 horas despus del procedimiento.  Si le han aplicado un anestsico local, le indicarn que haga reposo en el centro quirrgico o en el consultorio del mdico hasta que se encuentre estable y se sienta lista por volver a su casa.  La recuperacin puede demorar hasta 3 semanas.  Podr sentir [employer]  clicos durante alrededor de 1 semana.  El sangrado o la hemorragia vaginal pueden durar entre 2 y 4 semanas.  Puede observar una secrecin de color negro que proviene de la vagina. Es la  crema colocada en el cuello para Recruitment consultant. Esto es normal.  Esta informacin no tiene Marine scientist el consejo del mdico. Asegrese de hacerle al mdico cualquier pregunta que tenga. Document Released: 08/30/2005 Document Revised: 07/23/2013 Document Reviewed: 05/16/2013 Elsevier Interactive Patient Education  2017 [employer].

## 2017-11-14 NOTE — Progress Notes (Signed)
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  Placed in lithotomy position. Cervix viewed with speculum and colposcope after application of acetic acid.   Colposcopy adequate?  yes [place] lesions? Yes 5-8 o'clock and 11 to 1 o'clock Punctation? no Mosaicism?  no Abnormal vasculature?  no Biopsies? Yes 4, 8, and 12 ECC? yes  COMMENTS:  Patient was given post procedure instructions.  She will return in 2 weeks for results.  Mora Bellman, MD

## 2017-11-21 ENCOUNTER — Telehealth: Payer: Self-pay | Admitting: General Practice

## 2017-11-21 NOTE — Telephone Encounter (Signed)
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 As discussed, she needs to come in for a LEEP procedure. Please schedule appointment with any LEEP performing providers

## 2017-11-21 NOTE — Telephone Encounter (Signed)
Called patient with pacific interpreter [phone] and informed her of results & recommendation. Patient verbalized understanding to all & will await phone call from front office for appt. Patient had no questions

## 2018-01-09 ENCOUNTER — Ambulatory Visit (INDEPENDENT_AMBULATORY_CARE_PROVIDER_SITE_OTHER): Payer: Self-pay | Admitting: Obstetrics and Gynecology

## 2018-01-09 ENCOUNTER — Encounter: Payer: Self-pay | Admitting: Obstetrics and Gynecology

## 2018-01-09 ENCOUNTER — Other Ambulatory Visit (HOSPITAL_COMMUNITY)
Admission: RE | Admit: 2018-01-09 | Discharge: 2018-01-09 | Disposition: A | Discharge status: Home / Self Care | Payer: Self-pay | Source: Ambulatory Visit | Attending: Obstetrics and Gynecology | Admitting: Obstetrics and Gynecology

## 2018-01-09 DIAGNOSIS — D069 Carcinoma in situ of cervix, unspecified: Secondary | ICD-10-CM | POA: Insufficient documentation

## 2018-01-09 DIAGNOSIS — Z3202 Encounter for pregnancy test, result negative: Secondary | ICD-10-CM

## 2018-01-09 LAB — POCT PREGNANCY, URINE: Preg Test, Ur: NEGATIVE

## 2018-01-09 NOTE — Progress Notes (Signed)
[employer] interpreter Daysha [phone]

## 2018-01-09 NOTE — Progress Notes (Signed)
Patient identified, informed consent obtained, signed copy in chart, time out performed.  Pap smear and colposcopy reviewed.   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  Cervix visualized. Paracervical block placed.  Medium size LOOP used to remove cone of cervix using blend of cut and cautery on LEEP machine.  Edges/Base cauterized with Ball.  Monsel's solution used for hemostasis.  Patient tolerated procedure well.  Patient given post procedure instructions.  Follow up for repeat pap or as needed pending results.

## 2018-01-09 NOTE — Addendum Note (Signed)
Addended by: Shelly Coss on: 01/09/2018 04:57 PM   Modules accepted: Orders

## 2018-01-15 ENCOUNTER — Encounter: Payer: Self-pay | Admitting: *Deleted

## 2018-01-17 ENCOUNTER — Telehealth: Payer: Self-pay | Admitting: Obstetrics and Gynecology

## 2018-01-17 NOTE — Telephone Encounter (Signed)
Patient contacted using Spanish interpreter and informed of LEEP results. 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 Denman George who recommended repeat excisional biopsy (given concern for positive margins) and sentinel lymph node biopsy.  This plan was discussed with the patient. She was informed of her upcoming appointment with Dr. Skeet Latch on 01/31/2018 at 2 pm (patient should arrive at 1:45) All questions were answered. Specific questions related to lymph node biopsy were deferred to the oncologist

## 2018-01-31 ENCOUNTER — Inpatient Hospital Stay: Payer: Self-pay | Attending: Gynecologic Oncology | Admitting: Gynecologic Oncology

## 2018-01-31 ENCOUNTER — Encounter: Payer: Self-pay | Admitting: Gynecologic Oncology

## 2018-01-31 VITALS — BP 127/81 | HR 84 | Temp 98.2°F | Resp 18 | Wt 153.8 lb

## 2018-01-31 DIAGNOSIS — C531 Malignant neoplasm of exocervix: Secondary | ICD-10-CM | POA: Insufficient documentation

## 2018-01-31 DIAGNOSIS — R87613 High grade squamous intraepithelial lesion on cytologic smear of cervix (HGSIL): Secondary | ICD-10-CM

## 2018-01-31 DIAGNOSIS — C530 Malignant neoplasm of endocervix: Secondary | ICD-10-CM

## 2018-01-31 NOTE — Patient Instructions (Signed)
Preparing for your Surgery  Plan for surgery on February 26, 2018 with Dr. Precious Haws at [facility].  You will be scheduled for a robotic assisted pelvic lymphadenectomy, cold knife conization of the cervix, possible exploratory laparotomy.  Pre-operative Testing -You will receive a phone call from presurgical testing at [facility] to arrange for a pre-operative testing appointment before your surgery.  This appointment normally occurs one to two weeks before your scheduled surgery.   -Bring your insurance card, copy of an advanced directive if applicable, medication list  -At that visit, you will be asked to sign a consent for a possible blood transfusion in case a transfusion becomes necessary during surgery.  The need for a blood transfusion is rare but having consent is a necessary part of your care.    -You should not be taking blood thinners or aspirin at least ten days prior to surgery unless instructed by your surgeon.  Day Before Surgery at [place] will be asked to take in a light diet the day before surgery.  Avoid carbonated beverages.  You will be advised to have nothing to eat or drink after midnight the evening before.    Eat a light diet the day before surgery.  Examples including soups, broths, toast, yogurt, mashed potatoes.  Things to avoid include carbonated beverages (fizzy beverages), raw fruits and raw vegetables, or beans.   If your bowels are filled with gas, your surgeon will have difficulty visualizing your pelvic organs which increases your surgical risks.  Your role in recovery Your role is to become active as soon as directed by your doctor, while still giving yourself time to heal.  Rest when you feel tired. You will be asked to do the following in order to speed your recovery:  - Cough and breathe deeply. This helps toclear and expand your lungs and can prevent pneumonia. You may be given a spirometer to practice deep breathing. A staff  member will show you how to use the spirometer. - Do mild physical activity. Walking or moving your legs help your circulation and body functions return to normal. A staff member will help you when you try to walk and will provide you with simple exercises. Do not try to get up or walk alone the first time. - Actively manage your pain. Managing your pain lets you move in comfort. We will ask you to rate your pain on a scale of zero to 10. It is your responsibility to tell your doctor or nurse where and how much you hurt so your pain can be treated.  Special Considerations -If you are diabetic, you may be placed on insulin after surgery to have closer control over your blood sugars to promote healing and recovery.  This does not mean that you will be discharged on insulin.  If applicable, your oral antidiabetics will be resumed when you are tolerating a solid diet.  -Your final pathology results from surgery should be available by the Friday after surgery and the results will be relayed to you when available.  -Dr. Lahoma Crocker is the Surgeon that assists your GYN Oncologist with surgery.  The next day after your surgery you will either see your GYN Oncologist or Dr. Lahoma Crocker.   Blood Transfusion Information WHAT IS A BLOOD TRANSFUSION? A transfusion is the replacement of blood or some of its parts. Blood is made up of multiple cells which provide different functions.  Red blood cells carry oxygen and are used for  blood loss replacement.  White blood cells fight against infection.  Platelets control bleeding.  Plasma helps clot blood.  Other blood products are available for specialized needs, such as hemophilia or other clotting disorders. BEFORE THE TRANSFUSION  Who gives blood for transfusions?   You may be able to donate blood to be used at a later date on yourself (autologous donation).  Relatives can be asked to donate blood. This is generally not any safer than if  you have received blood from a stranger. The same precautions are taken to ensure safety when a relative's blood is donated.  Healthy volunteers who are fully evaluated to make sure their blood is safe. This is blood bank blood. Transfusion therapy is the safest it has ever been in the practice of medicine. Before blood is taken from a donor, a complete history is taken to make sure that person has no history of diseases nor engages in risky social behavior (examples are intravenous drug use or sexual activity with multiple partners). The donor's travel history is screened to minimize risk of transmitting infections, such as malaria. The donated blood is tested for signs of infectious diseases, such as HIV and hepatitis. The blood is then tested to be sure it is compatible with you in order to minimize the chance of a transfusion reaction. If you or a relative donates blood, this is often done in anticipation of surgery and is not appropriate for emergency situations. It takes many days to process the donated blood. RISKS AND COMPLICATIONS Although transfusion therapy is very safe and saves many lives, the main dangers of transfusion include:   Getting an infectious disease.  Developing a transfusion reaction. This is an allergic reaction to something in the blood you were given. Every precaution is taken to prevent this. The decision to have a blood transfusion has been considered carefully by your caregiver before blood is given. Blood is not given unless the benefits outweigh the risks.

## 2018-01-31 NOTE — Progress Notes (Signed)
Consult Note: Gyn-Onc  Consult was requested by Dr. Elly Modena for the evaluation of Kim Sanders 27 y.o. female  CC: Cervical cancer  Assessment/Plan:  [name] is a 27 y.o. 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  There is evidence of high-grade squamous intraepithelial lesion focally along the ectocervical margin.  The endocervical margin is negative.  Of note small foci suspicious for lymphovascular space invasion is noted in the specimen. Kim Sanders has not satisfied parity.  The recommendation was for a cold knife cone biopsy and robotic assisted bilateral pelvic lymph node dissection.  The risks and benefits of the procedure were discussed including infection bleeding damage to surrounding structures prolonged hospitalization and reoperation.  She was counseled that if metastatic disease is identified in the lymph nodes adjuvant treatment would be discussed.  The visit was conducted with the use of an interpreter.  All of the patient and her husband's questions were answered to their satisfaction.  She also had the opportunity to meet Dr. Gerarda Fraction who will be her surgeon for the procedure scheduled for  February 26, 2018  HPI: Kim Sanders is a 27 y.o. gravida 4 para 3 with an intrapartum diagnosis ASCUS cannot rule out high-grade lesion on Pap testing in March 2018.  Normal spontaneous vaginal delivery May 2018 she underwent colposcopy with biopsy in December 2018 that demonstrated CIN-3 ECC was negative in May 2008 patient underwent normal spontaneous vaginal delivery. LEEP was performed on January 09, 2018 and was notable for a invasive squamous cell carcinoma arising in a background of high-grade squamous intraepithelial lesion.  Lesion measured 1 mm in depth where the cervix was 9 mm in thickness with the extent of cancer of 4 mm.  Of note there was a small focus  suspicious for lymphovascular invasion.  The endocervical margin was negative for squamous intraepithelial lesion or carcinoma however the ectocervical margin was focally noted to have high-grade squamous intraepithelial lesion. Kim Sanders states that she has not completed her childbearing and is currently using Depo-Provera for birth control.  Denies any weight loss vaginal pain she reports discharge since LEEP on her health is otherwise unremarkable  Review of Systems:  Constitutional  Feels well,  Cardiovascular  No chest pain, shortness of breath, or edema  Pulmonary  No cough or wheeze.  Gastro Intestinal  No nausea, vomitting, or diarrhoea. No bright red blood per rectum, no abdominal pain, change in bowel movement, or constipation.  Genito Urinary  No frequency, urgency, dysuria, vaginal spotting Musculo Skeletal  No myalgia, arthralgia, joint swelling or pain  Neurologic  No weakness, numbness, change in gait,  Psychology  No depression, anxiety, insomnia.    Current Meds:  Outpatient Encounter Medications as of 01/31/2018  Medication Sig  . Prenatal Vit-Fe Fumarate-FA (MULTIVITAMIN-PRENATAL) 27-0.8 MG TABS tablet Take 1 tablet by mouth daily at 12 noon.  . [DISCONTINUED] ibuprofen (ADVIL,MOTRIN) 600 MG tablet Take 1 tablet (600 mg total) by mouth every 6 (six) hours. (Patient not taking: Reported on 11/14/2017)   No facility-administered encounter medications on file as of 01/31/2018.     Allergy: No Known Allergies  Social Hx:   Social History   Socioeconomic History  . Marital status: Married    Spouse name: Not on file  . Number of children: Not on file  . Years of education: Not on file  . Highest education level: Not on file  Social Needs  .  Financial resource strain: Not on file  . Food insecurity - worry: Not on file  . Food insecurity - inability: Not on file  . Transportation needs - medical: Not on file  . Transportation needs -  non-medical: Not on file  Occupational History  . Not on file  Tobacco Use  . Smoking status: Never Smoker  . Smokeless tobacco: Never Used  Substance and Sexual Activity  . Alcohol use: No  . Drug use: No  . Sexual activity: Yes    Birth control/protection: None  Other Topics Concern  . Not on file  Social History Narrative  . Not on file    Past Surgical Hx:  Past Surgical History:  Procedure Laterality Date  . DILATION AND CURETTAGE OF UTERUS      Past Medical Hx:  Past Medical History:  Diagnosis Date  . Medical history non-contributory     Past Gynecological History: Menarche at age 37 with regular menses last pregnancy May 2018 3 normal spontaneous vaginal deliveries for Depo-Provera for birth control denies any history of abnormal Pap prior to that in March 2018   Family Hx:  Family History  Problem Relation Age of Onset  . Hypertension Father     Vitals:  Blood pressure 127/81, pulse 84, temperature 98.2 F (36.8 C), temperature source Oral, resp. rate 18, weight 153 lb 12.8 oz (69.8 kg), SpO2 98 %, currently breastfeeding.  Physical Exam: WD in NAD Neck  Supple NROM, without any enlargements.  Lymph Node Survey No cervical supraclavicular or inguinal adenopathy Cardiovascular  Pulse normal rate, regularity and rhythm. S1 and S2 normal.  Lungs  Clear to auscultation bilaterally, without wheezes/crackles/rhonchi. Good air movement.  Skin  No rash/lesions/breakdown  Psychiatry  Alert and oriented appropriate mood affect speech and reasoning. Abdomen  Normoactive bowel sounds, abdomen soft, non-tender.  Back No CVA tenderness Genito Urinary  Vulva/vagina: Normal external female genitalia.  No lesions. No discharge or bleeding.  Bladder/urethra:  No lesions or masses  Vagina: Blood and discharge within the vaginal vault  Cervix: Cervical crater is healing squamocolumnar junction appreciated on the exocervix ppearing, no lesions.  Uterus: Mobile, no  parametrial involvement or nodularity.  Adnexa: No palpable masses. Rectal  Good tone, no masses no cul de sac nodularity no parametrial disease  Extremities  No bilateral cyanosis, clubbing or edema.   [name], MD, PhD 01/31/2018, 3:32 PM

## 2018-01-31 NOTE — H&P (View-Only) (Signed)
Consult Note: Gyn-Onc  Consult was requested by Dr. Elly Modena for the evaluation of Kim Sanders 27 y.o. female  CC: Cervical cancer  Assessment/Plan:  [name] is a 27 y.o. 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  There is evidence of high-grade squamous intraepithelial lesion focally along the ectocervical margin.  The endocervical margin is negative.  Of note small foci suspicious for lymphovascular space invasion is noted in the specimen. Kim Sanders has not satisfied parity.  The recommendation was for a cold knife cone biopsy and robotic assisted bilateral pelvic lymph node dissection.  The risks and benefits of the procedure were discussed including infection bleeding damage to surrounding structures prolonged hospitalization and reoperation.  She was counseled that if metastatic disease is identified in the lymph nodes adjuvant treatment would be discussed.  The visit was conducted with the use of an interpreter.  All of the patient and her husband's questions were answered to their satisfaction.  She also had the opportunity to meet Dr. Gerarda Fraction who will be her surgeon for the procedure scheduled for  February 26, 2018  HPI: [name] is a 27 y.o. gravida 4 para 3 with an intrapartum diagnosis ASCUS cannot rule out high-grade lesion on Pap testing in March 2018.  Normal spontaneous vaginal delivery May 2018 she underwent colposcopy with biopsy in December 2018 that demonstrated CIN-3 ECC was negative in May 2008 patient underwent normal spontaneous vaginal delivery. LEEP was performed on January 09, 2018 and was notable for a invasive squamous cell carcinoma arising in a background of high-grade squamous intraepithelial lesion.  Lesion measured 1 mm in depth where the cervix was 9 mm in thickness with the extent of cancer of 4 mm.  Of note there was a small focus  suspicious for lymphovascular invasion.  The endocervical margin was negative for squamous intraepithelial lesion or carcinoma however the ectocervical margin was focally noted to have high-grade squamous intraepithelial lesion. Kim Sanders states that she has not completed her childbearing and is currently using Depo-Provera for birth control.  Denies any weight loss vaginal pain she reports discharge since LEEP on her health is otherwise unremarkable  Review of Systems:  Constitutional  Feels well,  Cardiovascular  No chest pain, shortness of breath, or edema  Pulmonary  No cough or wheeze.  Gastro Intestinal  No nausea, vomitting, or diarrhoea. No bright red blood per rectum, no abdominal pain, change in bowel movement, or constipation.  Genito Urinary  No frequency, urgency, dysuria, vaginal spotting Musculo Skeletal  No myalgia, arthralgia, joint swelling or pain  Neurologic  No weakness, numbness, change in gait,  Psychology  No depression, anxiety, insomnia.    Current Meds:  Outpatient Encounter Medications as of 01/31/2018  Medication Sig  . Prenatal Vit-Fe Fumarate-FA (MULTIVITAMIN-PRENATAL) 27-0.8 MG TABS tablet Take 1 tablet by mouth daily at 12 noon.  . [DISCONTINUED] ibuprofen (ADVIL,MOTRIN) 600 MG tablet Take 1 tablet (600 mg total) by mouth every 6 (six) hours. (Patient not taking: Reported on 11/14/2017)   No facility-administered encounter medications on file as of 01/31/2018.     Allergy: No Known Allergies  Social Hx:   Social History   Socioeconomic History  . Marital status: Married    Spouse name: Not on file  . Number of children: Not on file  . Years of education: Not on file  . Highest education level: Not on file  Social Needs  .  Financial resource strain: Not on file  . Food insecurity - worry: Not on file  . Food insecurity - inability: Not on file  . Transportation needs - medical: Not on file  . Transportation needs -  non-medical: Not on file  Occupational History  . Not on file  Tobacco Use  . Smoking status: Never Smoker  . Smokeless tobacco: Never Used  Substance and Sexual Activity  . Alcohol use: No  . Drug use: No  . Sexual activity: Yes    Birth control/protection: None  Other Topics Concern  . Not on file  Social History Narrative  . Not on file    Past Surgical Hx:  Past Surgical History:  Procedure Laterality Date  . DILATION AND CURETTAGE OF UTERUS      Past Medical Hx:  Past Medical History:  Diagnosis Date  . Medical history non-contributory     Past Gynecological History: Menarche at age 50 with regular menses last pregnancy May 2018 3 normal spontaneous vaginal deliveries for Depo-Provera for birth control denies any history of abnormal Pap prior to that in March 2018   Family Hx:  Family History  Problem Relation Age of Onset  . Hypertension Father     Vitals:  Blood pressure 127/81, pulse 84, temperature 98.2 F (36.8 C), temperature source Oral, resp. rate 18, weight 153 lb 12.8 oz (69.8 kg), SpO2 98 %, currently breastfeeding.  Physical Exam: WD in NAD Neck  Supple NROM, without any enlargements.  Lymph Node Survey No cervical supraclavicular or inguinal adenopathy Cardiovascular  Pulse normal rate, regularity and rhythm. S1 and S2 normal.  Lungs  Clear to auscultation bilaterally, without wheezes/crackles/rhonchi. Good air movement.  Skin  No rash/lesions/breakdown  Psychiatry  Alert and oriented appropriate mood affect speech and reasoning. Abdomen  Normoactive bowel sounds, abdomen soft, non-tender.  Back No CVA tenderness Genito Urinary  Vulva/vagina: Normal external female genitalia.  No lesions. No discharge or bleeding.  Bladder/urethra:  No lesions or masses  Vagina: Blood and discharge within the vaginal vault  Cervix: Cervical crater is healing squamocolumnar junction appreciated on the exocervix ppearing, no lesions.  Uterus: Mobile, no  parametrial involvement or nodularity.  Adnexa: No palpable masses. Rectal  Good tone, no masses no cul de sac nodularity no parametrial disease  Extremities  No bilateral cyanosis, clubbing or edema.   [name], MD, PhD 01/31/2018, 3:32 PM

## 2018-02-21 ENCOUNTER — Encounter (HOSPITAL_COMMUNITY): Payer: Self-pay

## 2018-02-21 NOTE — Patient Instructions (Addendum)
Instrucciones:  Su cirugia esta programada para-( your procedure is scheduled on) :Tuesday, February 26, 2018  Entre por la entrada principal a la(s) -(enter through the main entrance at): 5:30AM  Demet[facility] Loll [phone] e informenos de su llegada ( pick up phone, dial [phone] on arrival)  Por favor llame al [phone] si tiene algun problema la [place] ( please call [phone] if you have any problems the morning of surgery.)  Recuerde: (Remember)  No coma alimentos ni tome liquidos, incluyendo agua, despues de la medianoche del  ( Do not eat food or drink liquids including water after midnight on Monday  Tome estas medicinas la manana de la cirugia con un sorbito de agua (take these meds the morning of surgery with a SIP of water) None  Puede cepillarse los dientes en la manana de la [place]. (you may brush your teeth the morning of surgery)  NO use joyas, maquillaje de ojos, lapiz labial, crema para el cuerpo o esmalte de unas oscuro - las unas de los pies pueden estar pintados o usar desodorante . ( Do not wear jewelry, eye makeup, lipstick, body lotion, or dark fingernail polish)  A los pacientes que se les de de alta el mismo dia no se les permitira manejar a casa.  ( Patients discharged on the day of surgery will not be allowed to drive home)  Use ropa suelta y comoda de regreso a Manufacturing engineer. ( wear loose comfortable clothes for ride home)  Firma del paciente (patient signature) ______________________________________      Your procedure is scheduled on: Tuesday, February 26, 2018   Surgery Time:   Report to [facility] by 5:30 AM pick up the phone at the front desk dial [phone], have a seat in the lobby and a staff member will come and escort you to Short Stay Department   Call this number if you have problems the morning of surgery [phone]   Eat a light diet the day before surgery. Examples including  soups, broths, toast, yogurt, mashed potatoes. Things to avoid include carbonated beverages (fizzy beverages), raw fruits and raw vegetables, or beans.     If your bowels are filled with gas, your surgeon will have difficulty visualizing your pelvic organs which increases your surgical risks.   Do not eat food or drink liquids :After Midnight.   Do NOT smoke after Midnight   Take these medicines the morning of surgery with A SIP OF WATER: None                                You may not have any metal on your body including hair pins, jewelry, and body piercings             Do not wear make-up, lotions, powders, perfumes/cologne, or deodorant             Do not wear nail polish.  Do not shave  48 hours prior to surgery.               Do not bring valuables to the hospital. [place].   Contacts, dentures or bridgework may not be worn into surgery.   Leave suitcase in the car. After surgery it may be brought to your room.  Special Instructions: Bring a copy of your healthcare power of attorney and living will documents         the day of surgery if you haven't scanned them in before.              Please read over the following fact sheets you were given:

## 2018-02-22 ENCOUNTER — Other Ambulatory Visit: Payer: Self-pay

## 2018-02-22 ENCOUNTER — Encounter (HOSPITAL_COMMUNITY)
Admission: RE | Admit: 2018-02-22 | Discharge: 2018-02-22 | Disposition: A | Discharge status: Home / Self Care | Payer: Self-pay | Source: Ambulatory Visit | Attending: Obstetrics | Admitting: Obstetrics

## 2018-02-22 ENCOUNTER — Encounter (HOSPITAL_COMMUNITY): Payer: Self-pay

## 2018-02-22 DIAGNOSIS — C539 Malignant neoplasm of cervix uteri, unspecified: Secondary | ICD-10-CM | POA: Insufficient documentation

## 2018-02-22 DIAGNOSIS — Z01818 Encounter for other preprocedural examination: Secondary | ICD-10-CM | POA: Insufficient documentation

## 2018-02-22 HISTORY — DX: Malignant neoplasm of cervix uteri, unspecified: C53.9

## 2018-02-22 LAB — COMPREHENSIVE METABOLIC PANEL
ALT: 15 U/L (ref 14–54)
AST: 19 U/L (ref 15–41)
Albumin: 4.9 g/dL (ref 3.5–5.0)
Alkaline Phosphatase: 61 U/L (ref 38–126)
Anion gap: 9 (ref 5–15)
BILIRUBIN TOTAL: 1.3 mg/dL — AB (ref 0.3–1.2)
BUN: 13 mg/dL (ref 6–20)
CO2: 25 mmol/L (ref 22–32)
CREATININE: 0.66 mg/dL (ref 0.44–1.00)
Calcium: 9.8 mg/dL (ref 8.9–10.3)
Chloride: 107 mmol/L (ref 101–111)
GFR calc Af Amer: >60 mL/min (ref >60)
Glucose, Bld: 100 mg/dL — ABNORMAL HIGH (ref 65–99)
Potassium: 4.1 mmol/L (ref 3.5–5.1)
Sodium: 141 mmol/L (ref 135–145)
TOTAL PROTEIN: 8.1 g/dL (ref 6.5–8.1)

## 2018-02-22 LAB — CBC
HEMATOCRIT: 40.1 % (ref 36.0–46.0)
Hemoglobin: 13.1 g/dL (ref 12.0–15.0)
MCH: 28.3 pg (ref 26.0–34.0)
MCHC: 32.7 g/dL (ref 30.0–36.0)
MCV: 86.6 fL (ref 78.0–100.0)
Platelets: 247 10*3/uL (ref 150–400)
RBC: 4.63 MIL/uL (ref 3.87–5.11)
RDW: 13 % (ref 11.5–15.5)
WBC: 8.3 10*3/uL (ref 4.0–10.5)

## 2018-02-22 LAB — ABO/RH: ABO/RH(D): O POS

## 2018-02-22 NOTE — Pre-Procedure Instructions (Signed)
CMP results 02/22/2018 faxed to Dr. Gerarda Fraction via epic.

## 2018-02-26 ENCOUNTER — Other Ambulatory Visit: Payer: Self-pay

## 2018-02-26 ENCOUNTER — Ambulatory Visit (HOSPITAL_COMMUNITY): Payer: Self-pay | Admitting: Certified Registered Nurse Anesthetist

## 2018-02-26 ENCOUNTER — Encounter (HOSPITAL_COMMUNITY): Payer: Self-pay | Admitting: Emergency Medicine

## 2018-02-26 ENCOUNTER — Ambulatory Visit (HOSPITAL_COMMUNITY)
Admission: RE | Admit: 2018-02-26 | Discharge: 2018-02-26 | Disposition: A | Discharge status: Home / Self Care | Payer: Self-pay | Source: Ambulatory Visit | Attending: Obstetrics | Admitting: Obstetrics

## 2018-02-26 ENCOUNTER — Encounter (HOSPITAL_COMMUNITY)
Admission: RE | Disposition: A | Discharge status: Home / Self Care | Payer: Self-pay | Source: Ambulatory Visit | Attending: Obstetrics

## 2018-02-26 DIAGNOSIS — C539 Malignant neoplasm of cervix uteri, unspecified: Secondary | ICD-10-CM | POA: Insufficient documentation

## 2018-02-26 DIAGNOSIS — D069 Carcinoma in situ of cervix, unspecified: Secondary | ICD-10-CM

## 2018-02-26 DIAGNOSIS — Z79899 Other long term (current) drug therapy: Secondary | ICD-10-CM | POA: Insufficient documentation

## 2018-02-26 HISTORY — PX: CERVICAL CONIZATION W/BX: SHX1330

## 2018-02-26 HISTORY — PX: ROBOTIC PELVIC AND PARA-AORTIC LYMPH NODE DISSECTION: SHX6210

## 2018-02-26 LAB — TYPE AND SCREEN
ABO/RH(D): O POS
ANTIBODY SCREEN: NEGATIVE

## 2018-02-26 LAB — PREGNANCY, URINE: Preg Test, Ur: NEGATIVE

## 2018-02-26 SURGERY — LYMPHADENECTOMY, PARA-AORTIC AND PELVIC, ROBOT-ASSISTED, LAPAROSCOPIC
Anesthesia: General

## 2018-02-26 MED ORDER — PROPOFOL 10 MG/ML IV BOLUS
INTRAVENOUS | Status: DC | PRN
  Administered 2018-02-26: 200 mg via INTRAVENOUS

## 2018-02-26 MED ORDER — FENTANYL CITRATE (PF) 100 MCG/2ML IJ SOLN
INTRAMUSCULAR | Status: DC | PRN
  Administered 2018-02-26: 50 ug via INTRAVENOUS
  Administered 2018-02-26: 100 ug via INTRAVENOUS
  Administered 2018-02-26: 25 ug via INTRAVENOUS
  Administered 2018-02-26: 100 ug via INTRAVENOUS
  Administered 2018-02-26: 25 ug via INTRAVENOUS

## 2018-02-26 MED ORDER — MEPERIDINE HCL 50 MG/ML IJ SOLN: 6.2500 mg | INTRAMUSCULAR | Status: DC | PRN

## 2018-02-26 MED ORDER — ONDANSETRON HCL 4 MG/2ML IJ SOLN
INTRAMUSCULAR | Status: DC | PRN
  Administered 2018-02-26 (×2): 4 mg via INTRAVENOUS

## 2018-02-26 MED ORDER — ESMOLOL HCL 100 MG/10ML IV SOLN
INTRAVENOUS | Status: DC | PRN
  Administered 2018-02-26: 50 mg via INTRAVENOUS

## 2018-02-26 MED ORDER — LIDOCAINE-EPINEPHRINE 0.5 %-1:200000 IJ SOLN
INTRAMUSCULAR | Status: DC | PRN
  Administered 2018-02-26: 30 mL

## 2018-02-26 MED ORDER — MIDAZOLAM HCL 5 MG/5ML IJ SOLN
INTRAMUSCULAR | Status: DC | PRN
  Administered 2018-02-26: 2 mg via INTRAVENOUS

## 2018-02-26 MED ORDER — LIDOCAINE-EPINEPHRINE 0.5 %-1:200000 IJ SOLN
INTRAMUSCULAR | Status: AC
  Filled 2018-02-26: qty 1

## 2018-02-26 MED ORDER — FERRIC SUBSULFATE 259 MG/GM EX SOLN
CUTANEOUS | Status: DC | PRN
  Administered 2018-02-26: 1

## 2018-02-26 MED ORDER — ONDANSETRON HCL 4 MG/2ML IJ SOLN
INTRAMUSCULAR | Status: AC
  Filled 2018-02-26: qty 2

## 2018-02-26 MED ORDER — CEFAZOLIN SODIUM-DEXTROSE 2-4 GM/100ML-% IV SOLN
2.0000 g | INTRAVENOUS | Status: AC
  Administered 2018-02-26: 2 g via INTRAVENOUS
  Filled 2018-02-26: qty 100

## 2018-02-26 MED ORDER — FENTANYL CITRATE (PF) 100 MCG/2ML IJ SOLN: 25.0000 ug | INTRAMUSCULAR | Status: DC | PRN

## 2018-02-26 MED ORDER — METOCLOPRAMIDE HCL 5 MG/ML IJ SOLN: 10.0000 mg | Freq: Once | INTRAMUSCULAR | Status: DC | PRN

## 2018-02-26 MED ORDER — LACTATED RINGERS IV SOLN
INTRAVENOUS | Status: DC | PRN
Start: 2018-02-26 — End: 2018-02-26
  Administered 2018-02-26 (×2): via INTRAVENOUS

## 2018-02-26 MED ORDER — PROPOFOL 10 MG/ML IV BOLUS
INTRAVENOUS | Status: AC
  Filled 2018-02-26: qty 20

## 2018-02-26 MED ORDER — IODINE STRONG (LUGOLS) 5 % PO SOLN
ORAL | Status: DC | PRN
  Administered 2018-02-26: 14 mL

## 2018-02-26 MED ORDER — LIDOCAINE 2% (20 MG/ML) 5 ML SYRINGE
INTRAMUSCULAR | Status: DC | PRN
  Administered 2018-02-26: 1.5 mg/kg/h via INTRAVENOUS

## 2018-02-26 MED ORDER — ROCURONIUM BROMIDE 10 MG/ML (PF) SYRINGE
PREFILLED_SYRINGE | INTRAVENOUS | Status: AC
  Filled 2018-02-26: qty 5

## 2018-02-26 MED ORDER — KETAMINE HCL 10 MG/ML IJ SOLN
INTRAMUSCULAR | Status: AC
  Filled 2018-02-26: qty 1

## 2018-02-26 MED ORDER — FERRIC SUBSULFATE 259 MG/GM EX SOLN
CUTANEOUS | Status: AC
  Filled 2018-02-26: qty 8

## 2018-02-26 MED ORDER — SCOPOLAMINE 1 MG/3DAYS TD PT72
MEDICATED_PATCH | TRANSDERMAL | Status: DC | PRN
  Administered 2018-02-26: 1 via TRANSDERMAL

## 2018-02-26 MED ORDER — DEXAMETHASONE SODIUM PHOSPHATE 10 MG/ML IJ SOLN
INTRAMUSCULAR | Status: AC
  Filled 2018-02-26: qty 1

## 2018-02-26 MED ORDER — SILVER NITRATE-POT NITRATE 75-25 % EX MISC
CUTANEOUS | Status: AC
  Filled 2018-02-26: qty 1

## 2018-02-26 MED ORDER — KETOROLAC TROMETHAMINE 30 MG/ML IJ SOLN
INTRAMUSCULAR | Status: DC | PRN
  Administered 2018-02-26: 30 mg via INTRAVENOUS

## 2018-02-26 MED ORDER — IODINE STRONG (LUGOLS) 5 % PO SOLN
ORAL | Status: AC
  Filled 2018-02-26: qty 1

## 2018-02-26 MED ORDER — FENTANYL CITRATE (PF) 100 MCG/2ML IJ SOLN
INTRAMUSCULAR | Status: AC
  Filled 2018-02-26: qty 2

## 2018-02-26 MED ORDER — BUPIVACAINE-EPINEPHRINE (PF) 0.5% -1:200000 IJ SOLN
INTRAMUSCULAR | Status: AC
  Filled 2018-02-26: qty 30

## 2018-02-26 MED ORDER — OXYCODONE-ACETAMINOPHEN 5-325 MG PO TABS: 1.0000 | ORAL_TABLET | ORAL | 0 refills | Status: DC | PRN

## 2018-02-26 MED ORDER — LACTATED RINGERS IV SOLN
INTRAVENOUS | Status: AC | PRN
  Administered 2018-02-26: 1

## 2018-02-26 MED ORDER — DEXAMETHASONE SODIUM PHOSPHATE 10 MG/ML IJ SOLN
INTRAMUSCULAR | Status: DC | PRN
  Administered 2018-02-26: 10 mg via INTRAVENOUS

## 2018-02-26 MED ORDER — STERILE WATER FOR IRRIGATION IR SOLN
Status: DC | PRN
  Administered 2018-02-26: 1000 mL

## 2018-02-26 MED ORDER — LIDOCAINE 2% (20 MG/ML) 5 ML SYRINGE
INTRAMUSCULAR | Status: DC | PRN
  Administered 2018-02-26: 80 mg via INTRAVENOUS

## 2018-02-26 MED ORDER — LIDOCAINE-EPINEPHRINE (PF) 1 %-1:200000 IJ SOLN
INTRAMUSCULAR | Status: DC | PRN
  Administered 2018-02-26: 8 mL

## 2018-02-26 MED ORDER — SUGAMMADEX SODIUM 200 MG/2ML IV SOLN
INTRAVENOUS | Status: DC | PRN
  Administered 2018-02-26: 200 mg via INTRAVENOUS

## 2018-02-26 MED ORDER — KETAMINE HCL 10 MG/ML IJ SOLN
INTRAMUSCULAR | Status: DC | PRN
  Administered 2018-02-26: 30 mg via INTRAVENOUS
  Administered 2018-02-26: 10 mg via INTRAVENOUS

## 2018-02-26 MED ORDER — LIDOCAINE 2% (20 MG/ML) 5 ML SYRINGE
INTRAMUSCULAR | Status: AC
  Filled 2018-02-26: qty 15

## 2018-02-26 MED ORDER — ROCURONIUM BROMIDE 10 MG/ML (PF) SYRINGE
PREFILLED_SYRINGE | INTRAVENOUS | Status: DC | PRN
  Administered 2018-02-26: 10 mg via INTRAVENOUS
  Administered 2018-02-26: 50 mg via INTRAVENOUS
  Administered 2018-02-26 (×2): 20 mg via INTRAVENOUS

## 2018-02-26 MED ORDER — LACTATED RINGERS IV SOLN: INTRAVENOUS | Status: DC

## 2018-02-26 MED ORDER — LIDOCAINE 2% (20 MG/ML) 5 ML SYRINGE
INTRAMUSCULAR | Status: AC
  Filled 2018-02-26: qty 5

## 2018-02-26 MED ORDER — MIDAZOLAM HCL 2 MG/2ML IJ SOLN
INTRAMUSCULAR | Status: AC
  Filled 2018-02-26: qty 2

## 2018-02-26 MED ORDER — KETOROLAC TROMETHAMINE 30 MG/ML IJ SOLN
INTRAMUSCULAR | Status: AC
  Filled 2018-02-26: qty 1

## 2018-02-26 MED ORDER — STERILE WATER FOR INJECTION IJ SOLN
INTRAMUSCULAR | Status: AC
  Filled 2018-02-26: qty 10

## 2018-02-26 MED ORDER — SCOPOLAMINE 1 MG/3DAYS TD PT72
MEDICATED_PATCH | TRANSDERMAL | Status: AC
  Filled 2018-02-26: qty 1

## 2018-02-26 MED ORDER — ESMOLOL HCL 100 MG/10ML IV SOLN
INTRAVENOUS | Status: AC
  Filled 2018-02-26: qty 20

## 2018-02-26 SURGICAL SUPPLY — 95 items
APPLIER CLIP UNV 5X34 EPIX (ENDOMECHANICALS) IMPLANT
BENZOIN TINCTURE PRP APPL 2/3 (GAUZE/BANDAGES/DRESSINGS) ×4 IMPLANT
CATH ROBINSON RED A/P 16FR (CATHETERS) ×4 IMPLANT
CLOSURE WOUND 1/2 X4 (GAUZE/BANDAGES/DRESSINGS) ×2
COVER BACK TABLE 60X90IN (DRAPES) ×4 IMPLANT
COVER SURGICAL LIGHT HANDLE (MISCELLANEOUS) ×4 IMPLANT
COVER TIP SHEARS 8 DVNC (MISCELLANEOUS) ×2 IMPLANT
COVER TIP SHEARS 8MM DA VINCI (MISCELLANEOUS) ×2
DRAPE ARM DVNC X/XI (DISPOSABLE) ×8 IMPLANT
DRAPE COLUMN DVNC XI (DISPOSABLE) ×2 IMPLANT
DRAPE DA VINCI XI ARM (DISPOSABLE) ×8
DRAPE DA VINCI XI COLUMN (DISPOSABLE) ×2
DRAPE INCISE IOBAN 66X45 STRL (DRAPES) ×4 IMPLANT
DRAPE SHEET LG 3/4 BI-LAMINATE (DRAPES) ×8 IMPLANT
DRAPE SURG IRRIG POUCH 19X23 (DRAPES) ×4 IMPLANT
DRAPE UNDERBUTTOCKS STRL (DRAPE) ×4 IMPLANT
DRSG TEGADERM 2-3/8X2-3/4 SM (GAUZE/BANDAGES/DRESSINGS) ×4 IMPLANT
DRSG TELFA 3X8 NADH (GAUZE/BANDAGES/DRESSINGS) ×4 IMPLANT
ELECT COATED BLADE 2.86 ST (ELECTRODE) ×4 IMPLANT
ELECT PENCIL ROCKER SW 15FT (MISCELLANEOUS) ×4 IMPLANT
ELECT REM PT RETURN 15FT ADLT (MISCELLANEOUS) ×4 IMPLANT
EXTENDER ELECT LOOP LEEP 10CM (CUTTING LOOP) ×4 IMPLANT
GAUZE SPONGE 2X2 8PLY STRL LF (GAUZE/BANDAGES/DRESSINGS) ×2 IMPLANT
GLOVE BIOGEL PI IND STRL 6.5 (GLOVE) ×6 IMPLANT
GLOVE BIOGEL PI IND STRL 7.0 (GLOVE) ×6 IMPLANT
GLOVE BIOGEL PI INDICATOR 6.5 (GLOVE) ×6
GLOVE BIOGEL PI INDICATOR 7.0 (GLOVE) ×6
GLOVE SURG SS PI 6.5 STRL IVOR (GLOVE) ×4 IMPLANT
GOWN STRL REUS W/ TWL LRG LVL3 (GOWN DISPOSABLE) ×6 IMPLANT
GOWN STRL REUS W/TWL LRG LVL3 (GOWN DISPOSABLE) ×14 IMPLANT
GYRUS RUMI II 2.5CM BLUE (DISPOSABLE) ×4
GYRUS RUMI II 3.5CM BLUE (DISPOSABLE) ×4
GYRUS RUMI II 4.0CM BLUE (DISPOSABLE) ×4
HEMOSTAT SURGICEL 2X4 FIBR (HEMOSTASIS) IMPLANT
HEMOSTAT SURGICEL 4X8 (HEMOSTASIS) ×4 IMPLANT
HOLDER FOLEY CATH W/STRAP (MISCELLANEOUS) ×4 IMPLANT
IRRIG SUCT STRYKERFLOW 2 WTIP (MISCELLANEOUS) ×4
IRRIGATION SUCT STRKRFLW 2 WTP (MISCELLANEOUS) ×2 IMPLANT
KIT BASIN OR (CUSTOM PROCEDURE TRAY) ×4 IMPLANT
KIT PROCEDURE DA VINCI SI (MISCELLANEOUS)
KIT PROCEDURE DVNC SI (MISCELLANEOUS) IMPLANT
MANIPULATOR UTERINE 4.5 ZUMI (MISCELLANEOUS) IMPLANT
NDL SAFETY ECLIPSE 18X1.5 (NEEDLE) IMPLANT
NEEDLE HYPO 18GX1.5 SHARP (NEEDLE)
NEEDLE SPNL 22GX3.5 QUINCKE BK (NEEDLE) ×4 IMPLANT
OBTURATOR OPTICAL STANDARD 8MM (TROCAR) ×2
OBTURATOR OPTICAL STND 8 DVNC (TROCAR) ×2
OBTURATOR OPTICALSTD 8 DVNC (TROCAR) ×2 IMPLANT
PACK LITHOTOMY IV (CUSTOM PROCEDURE TRAY) ×4 IMPLANT
PACK ROBOT GYN CUSTOM WL (TRAY / TRAY PROCEDURE) ×4 IMPLANT
PAD OB MATERNITY 4.3X12.25 (PERSONAL CARE ITEMS) ×4 IMPLANT
RUMI II 3.0CM BLUE KOH-EFFICIE (DISPOSABLE) ×4 IMPLANT
RUMI II GYRUS 2.5CM BLUE (DISPOSABLE) ×2 IMPLANT
RUMI II GYRUS 3.5CM BLUE (DISPOSABLE) ×2 IMPLANT
RUMI II GYRUS 4.0CM BLUE (DISPOSABLE) ×2 IMPLANT
SCISSORS LAP 5X35 DISP (ENDOMECHANICALS) IMPLANT
SCOPETTES 8  STERILE (MISCELLANEOUS) ×4
SCOPETTES 8 STERILE (MISCELLANEOUS) ×4 IMPLANT
SEAL CANN UNIV 5-8 DVNC XI (MISCELLANEOUS) ×8 IMPLANT
SEAL XI 5MM-8MM UNIVERSAL (MISCELLANEOUS) ×8
SET TRI-LUMEN FLTR TB AIRSEAL (TUBING) ×4 IMPLANT
SLEEVE ADV FIXATION 12X100MM (TROCAR) IMPLANT
SOLUTION ELECTROLUBE (MISCELLANEOUS) ×4 IMPLANT
SPONGE GAUZE 2X2 STER 10/PKG (GAUZE/BANDAGES/DRESSINGS) ×2
SPONGE LAP 18X18 X RAY DECT (DISPOSABLE) ×4 IMPLANT
SPONGE SURGIFOAM ABS GEL 12-7 (HEMOSTASIS) IMPLANT
STRIP CLOSURE SKIN 1/2X4 (GAUZE/BANDAGES/DRESSINGS) ×6 IMPLANT
SURGIFLO W/THROMBIN 8M KIT (HEMOSTASIS) ×4 IMPLANT
SUT MNCRL AB 4-0 PS2 18 (SUTURE) IMPLANT
SUT SILK 2 0 SH (SUTURE) ×4 IMPLANT
SUT VIC AB 0 CT1 27 (SUTURE) ×4
SUT VIC AB 0 CT1 27XBRD ANTBC (SUTURE) ×4 IMPLANT
SUT VIC AB 0 CT2 27 (SUTURE) ×12 IMPLANT
SUT VIC AB 2-0 CT2 27 (SUTURE) ×4 IMPLANT
SUT VIC AB 4-0 PS2 18 (SUTURE) ×16 IMPLANT
SUT VICRYL 0 UR6 27IN ABS (SUTURE) ×8 IMPLANT
SUT VLOC 180 0 9IN  GS21 (SUTURE) ×2
SUT VLOC 180 0 9IN GS21 (SUTURE) ×2 IMPLANT
SYR 10ML LL (SYRINGE) IMPLANT
SYR CONTROL 10ML LL (SYRINGE) ×4 IMPLANT
SYS RETRIEVAL 5MM INZII UNIV (BASKET)
SYSTEM RETRIEVL 5MM INZII UNIV (BASKET) IMPLANT
TAPE STRIPS DRAPE STRL (GAUZE/BANDAGES/DRESSINGS) ×4 IMPLANT
TIP UTERINE 5.1X6CM LAV DISP (MISCELLANEOUS) ×4 IMPLANT
TIP UTERINE 6.7X10CM GRN DISP (MISCELLANEOUS) ×4 IMPLANT
TIP UTERINE 6.7X6CM WHT DISP (MISCELLANEOUS) ×4 IMPLANT
TIP UTERINE 6.7X8CM BLUE DISP (MISCELLANEOUS) ×4 IMPLANT
TOWEL OR 17X26 10 PK STRL BLUE (TOWEL DISPOSABLE) ×4 IMPLANT
TOWEL OR NON WOVEN STRL DISP B (DISPOSABLE) ×4 IMPLANT
TRAP SPECIMEN MUCOUS 40CC (MISCELLANEOUS) IMPLANT
TRAY FOLEY W/METER SILVER 16FR (SET/KITS/TRAYS/PACK) ×4 IMPLANT
TROCAR Z-THREAD FIOS 5X100MM (TROCAR) ×4 IMPLANT
UNDERPAD 30X30 (UNDERPADS AND DIAPERS) ×4 IMPLANT
WATER STERILE IRR 1000ML POUR (IV SOLUTION) ×4 IMPLANT
YANKAUER SUCT BULB TIP 10FT TU (MISCELLANEOUS) ×4 IMPLANT

## 2018-02-26 NOTE — Anesthesia Preprocedure Evaluation (Signed)
Anesthesia Evaluation  Patient identified by MRN, date of birth, ID band Patient awake    Reviewed: Allergy & Precautions, NPO status , Patient's Chart, lab work & pertinent test results  Airway Mallampati: II  TM Distance: >3 FB Neck ROM: Full    Dental no notable dental hx.    Pulmonary neg pulmonary ROS,    Pulmonary exam normal breath sounds clear to auscultation       Cardiovascular negative cardio ROS Normal cardiovascular exam Rhythm:Regular Rate:Normal     Neuro/Psych negative neurological ROS  negative psych ROS   GI/Hepatic negative GI ROS, Neg liver ROS,   Endo/Other  negative endocrine ROS  Renal/GU negative Renal ROS  negative genitourinary   Musculoskeletal negative musculoskeletal ROS (+)   Abdominal   Peds negative pediatric ROS (+)  Hematology negative hematology ROS (+)   Anesthesia Other Findings   Reproductive/Obstetrics negative OB ROS                             Anesthesia Physical Anesthesia Plan  ASA: II  Anesthesia Plan: General   Post-op Pain Management:    Induction: Intravenous  PONV Risk Score and Plan: 4 or greater and Ondansetron, Dexamethasone, Midazolam, Scopolamine patch - Pre-op and Treatment may vary due to age or medical condition  Airway Management Planned: Oral ETT  Additional Equipment:   Intra-op Plan:   Post-operative Plan: Extubation in OR  Informed Consent: I have reviewed the patients History and Physical, chart, labs and discussed the procedure including the risks, benefits and alternatives for the proposed anesthesia with the patient or authorized representative who has indicated his/her understanding and acceptance.   Dental advisory given  Plan Discussed with: CRNA  Anesthesia Plan Comments:         Anesthesia Quick Evaluation  

## 2018-02-26 NOTE — Anesthesia Postprocedure Evaluation (Signed)
Anesthesia Post Note  Patient: Kim Sanders  Procedure(s) Performed: XI ROBOTIC BILATERAL PELVIC  LYMPH NODE DISSECTION (Bilateral ) COLD KNIFE CONIZATION OF CERVIX (N/A )     Patient location during evaluation: PACU Anesthesia Type: General Level of consciousness: awake and alert Pain management: pain level controlled Vital Signs Assessment: post-procedure vital signs reviewed and stable Respiratory status: spontaneous breathing, nonlabored ventilation, respiratory function stable and patient connected to nasal cannula oxygen Cardiovascular status: blood pressure returned to baseline and stable Postop Assessment: no apparent nausea or vomiting Anesthetic complications: no    Last Vitals:  Vitals:   02/26/18 1320 02/26/18 1415  BP: 108/75 116/73  Pulse: 78 68  Resp: 16 16  Temp:  36.7 C  SpO2: 98% 99%    Last Pain:  Vitals:   02/26/18 1415  TempSrc: Oral  PainSc: 3                  Montez Hageman

## 2018-02-26 NOTE — Op Note (Signed)
OPERATIVE NOTE  Surgeon: Rutha Bouchard  Assistants: Dr Lahoma Crocker (an MD assistant was necessary for tissue manipulation, management of robotic instrumentation, retraction and positioning due to the complexity of the case and hospital policies).   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 Robotic-assisted bilateral pelvic lymph node dissection  2. Cold knife conization  Operative Findings:  : Normal adnexa bilaterally.  Normal-appearing uterus.  No enlarged lymph nodes.  Posterior cervical lip was flush with the posterior vagina therefore a very small conization had to be performed  Estimated Blood Loss:  less than 50 mL             Specimens: Bilateral pelvic lymph nodes. Cervical cone.         Complications:  None; patient tolerated the procedure well.         Disposition: PACU - hemodynamically stable.  Procedure Details  The patient was identified as Forensic psychologist and the procedure verified as a Robotic-assisted bilateral pelvic lymphadenectomy and cervical conization.  A Time Out was held and the above information confirmed.  After induction of anesthesia, the patient was draped and prepped in the usual sterile manner. Pt was placed in supine position after anesthesia and draped and prepped in the usual sterile manner. The abdominal drape was placed after the CholoraPrep had been allowed to dry for 3 minutes.  Her arms were tucked to her side with all appropriate precautions.  The shoulders were stabilized with padded shoulder blocks applied to the acromium processes.  The patient was placed in the semi-lithotomy position in [place].  The perineum was prepped with Betadine. The patient was then prepped. Foley catheter was placed.  A sponge on a stick was placed per vagina.   OG tube placement was confirmed.   Next, a 5 mm skin  incision was made 1 cm below the subcostal margin in the midclavicular line(ie Palmer's point).  The 5 mm Optiview port and scope was used for direct entry.  Opening pressure was under 10 mm CO2.  The abdomen was insufflated and the findings were noted as above.   At this point and all points during the procedure, the patient's intra-abdominal pressure did not exceed 15 mmHg. Next, an 8 mm skin incision was made just superior to the umbilicus and a right and left port sites were placed about 8cm lateral to the camera port on the right and left side.  A fourth arm was placed on the far right lateral side approximately 6 cm from the previous right-sided trocar.  The trochars were all placed under direct visualization.  An assistant 10-12 mm Airseal trocar was placed on the left lateral side of the patient also under direct visualization.  The patient was placed in steep Trendelenburg.  Bowel was folded away into the upper abdomen.  The robot was docked in the normal manner.  A Ray-Tec was placed into the abdomen  The right and left peritoneum were opened parallel to the IP ligament to open the retroperitoneal spaces bilaterally. The pararectal and paravesical spaces were opened up entirely with careful dissection below the level of the ureters bilaterally and to the depth of the internal iliac and obturator nerve.  Pelvic lymphadenectomy was then performed using gentle dissection and cautery. This was performed bilaterally with no complications.  Borders as follows; the genitofemoral nerve laterally, the deep circumflex vein caudad, ~1cm superior to the bifurcation  of the internal/external iliac artery cephalad, and then the obturator nerve at the floor,  the ureter and superior vesicle artery medially.  The left nodal tissue was placed in an Endo Catch bag labeled as left pelvic lymph nodes.  The right pelvic lymph nodes were also placed in a separate Endo Catch bag and labeled as right pelvic lymph nodes.  The  Ray-Tec was removed from the abdomen.  Irrigation was used and hemostasis was noted. Robotic instruments were removed under direct visulaization. The robot was undocked. The 10-12 mm port fascia was closed with 0 Vicryl on a UR-5 needle.  The skin was closed with 4-0 Vicryl and 4-0 monocryl in a subcuticular manner.  Sponge, lap and needle counts correct.   Attention was turned to the cervical conization procedure.  The weighted speculum was placed in the posterior vagina. The right angle retractor was placed anteriorally to visualize the cervix. 10cc of 1% lidocaine with epinephrine was infiltrated into 2, 4, 6, 8:00.  A 0-vicryl suture on a CT needle was used to place stay sutures (which were tagged) at 3 and 9 o'clock of the cervicovaginal junction. The uterine sound was placed in the cervix to delineate the course of the endocervical canal.  Note the posterior lip of the cervix was essentially absent from her prior procedure and the posterior surface of the vagina was immediately adjacent to the external os. I was able to provide some traction with a tenaculum in order to better visualize where I may have some posterior cervical tissue remaining.  Lugol's was applied there were no abnormal lesions.  An 11 blade scalpel on a long knife handle was used to make an incision around the face of the cervix, inside of the stay sutures, circumferentially.  The specimen was out of necessity small given the anatomical constraints.  A pickup with teeth was used to manipulate the tissue and Mayo scissors were used to complete the excision.  Excision was angled towards the endocervix to amputate the specimen. It was removed, oriented with a marking stitch   The long bovie with protected tip was used to create hemostasis at the surgical bed. Monsels solution on Surgicel was applied to the surgical bed to consolidate this hemostasis the stay sutures were approximated in the midline.  All instrument, suture, laparotomy,  Ray-Tec, and needle counts were correct x2. The patient tolerated the procedure well and was taken recovery room in stable condition.

## 2018-02-26 NOTE — Discharge Instructions (Signed)
Conizacin del cuello del tero, cuidados posteriores (Conization of the Cervix, Care After) Siga estas instrucciones durante las prximas semanas. Estas indicaciones le proporcionan informacin acerca de cmo deber cuidarse despus del procedimiento. El mdico tambin podr darle instrucciones ms especficas. El tratamiento se ha planificado de acuerdo a las prcticas mdicas actuales, pero a veces se producen problemas. Comunquese con el mdico si tiene algn problema o tiene dudas despus del procedimiento. QU ESPERAR DESPUS DEL PROCEDIMIENTO Despus del procedimiento, es tpico tener las siguientes sensaciones:  Si le han administrado anestesia general se sentir mareada durante 2 o 3 horas despus del procedimiento.  Podr sentir clicos (similar a los Stage manager) durante aproximadamente 1 semana.  Puede tener sangrado o hemorragia vaginal leves o moderados durante 1 o 2semanas. El sangrado no debe ser abundante (por ejemplo, no debe empapar un apsito en menos de 1 hora).  Podr tener una secrecin vaginal oscura, similar a la borra del caf. Es la pasta que le han aplicado en el cuello del tero para controlar el sangrado. Esto es normal. La recuperacin puede demorar hasta 3 semanas. INSTRUCCIONES PARA EL CUIDADO EN EL HOGAR  Pdale a alguna persona que la lleve a su casa luego del procedimiento.  Solo tome los [employer] indic el mdico. No tome aspirina. Puede ocasionar hemorragias.  Durante la primera semana tome duchas. No tome baos, no practique natacin ni use el jacuzzi hasta que el mdico la autorice.  No se haga duchas vaginales, no utilice tampones ni tenga relaciones sexuales hasta que el mdico la autorice.  Evite las actividades extenuantes y la prctica de ejercicios durante al menos 7 a 14 das.  Podr volver a su dieta normal, excepto que el mdico le indique otra cosa.  Si est estreida, podr: ? Tomar un laxante suave segn las  indicaciones del mdico. ? Agregar frutas y salvado a su dieta. ? Debe ingerir gran cantidad de lquido para mantener la orina de tono claro o color amarillo plido.  Cumpla con todas las visitas de control con su mdico.  SOLICITE ATENCIN MDICA SI:  Le aparece una erupcin cutnea.  Se siente mareada o sufre un desmayo.  Siente nuseas.  Tiene una secrecin vaginal con mal olor.  SOLICITE ATENCIN MDICA DE INMEDIATO SI:  Observa cogulos sanguneos o una hemorragia ms abundante que un perodo menstrual normal (por ejemplo, si empapa un apsito en menos de 1 hora) o si la hemorragia es de color rojo brillante.  Tiene fiebre de ms de 101F (38,3C) o sntomas persistentes durante ms de 2 o 3das.  Tiene fiebre de ms de 101F (38,3C) y los sntomas empeoran repentinamente.  Siente cada vez ms clicos.  Se desmaya.  Siente dolor al [employer].  La orina tiene [place].  Comienza a vomitar.  El dolor no se alivia con los [employer].  El dolor es intenso o [place].  ASEGRESE DE QUE:  Comprende estas instrucciones.  Controlar su afeccin.  Recibir ayuda de inmediato si no mejora o si empeora.  Esta informacin no tiene Marine scientist el consejo del mdico. Asegrese de hacerle al mdico cualquier pregunta que tenga. Document Released: 07/23/2013 Document Revised: 11/25/2013 Document Reviewed: 05/16/2013 Elsevier Interactive Patient Education  2017 [employer].

## 2018-02-26 NOTE — Anesthesia Procedure Notes (Signed)
Procedure Name: Intubation Date/Time: 02/26/2018 7:41 AM Performed by: [place] Sparkle Aube C, CRNA Pre-anesthesia Checklist: Patient identified, Emergency Drugs available, Suction available and Patient being monitored Patient Re-evaluated:Patient Re-evaluated prior to induction Oxygen Delivery Method: Circle system utilized Preoxygenation: Pre-oxygenation with 100% oxygen Induction Type: IV induction Ventilation: Mask ventilation without difficulty Laryngoscope Size: Mac and 3 Grade View: Grade I Tube type: Oral Tube size: 7.0 mm Number of attempts: 1 Airway Equipment and Method: Stylet and Oral airway Placement Confirmation: ETT inserted through vocal cords under direct vision,  positive ETCO2 and breath sounds checked- equal and bilateral Secured at: 20 cm Tube secured with: Tape Dental Injury: Teeth and Oropharynx as per pre-operative assessment  Comments: Intubation performed by EMT Student Tom   With supervision by CRNA and MDA

## 2018-02-26 NOTE — Interval H&P Note (Signed)
Today we reviewed the reasons for surgery as noted below and that a cervical conization would be performed. She understood, through the use of an interpreter, that this may lead to shortening of the cervix and the inherent risks with future pregnancies. All questions were answered. Otherwise no changes since H&P as noted.  History and Physical Interval Note:  02/26/2018 7:06 AM  [name]  has presented today for surgery, with the diagnosis of Cervical Cancer  The various methods of treatment have been discussed with the patient and family. After consideration of risks, benefits and other options for treatment, the patient has consented to  Procedure(s): XI ROBOTIC BILATERAL PELVIC  LYMPH NODE DISSECTION (Bilateral) COLD KNIFE CONIZATION OF CERVIX (N/A) as a surgical intervention .  The patient's history has been reviewed, patient examined, no change in status, stable for surgery.  I have reviewed the patient's chart and labs.  Questions were answered to the patient's satisfaction.     Kim Sanders

## 2018-02-26 NOTE — Transfer of Care (Signed)
Immediate Anesthesia Transfer of Care Note  Patient: Viveka Beltran-Gallegos  Procedure(s) Performed: XI ROBOTIC BILATERAL PELVIC  LYMPH NODE DISSECTION (Bilateral ) COLD KNIFE CONIZATION OF CERVIX (N/A )  Patient Location: PACU  Anesthesia Type:General  Level of Consciousness: awake, alert  and oriented  Airway & Oxygen Therapy: Patient Spontanous Breathing and Patient connected to face mask oxygen  Post-op Assessment: Report given to RN and Post -op Vital signs reviewed and stable  Post vital signs: Reviewed and stable  Last Vitals:  Vitals Value Taken Time  BP 126/73 02/26/2018 12:00 PM  Temp    Pulse 110 02/26/2018 12:01 PM  Resp 18 02/26/2018 12:01 PM  SpO2 100 % 02/26/2018 12:01 PM  Vitals shown include unvalidated device data.  Last Pain:  Vitals:   02/26/18 0604  TempSrc: Oral  PainSc:       Patients Stated Pain Goal: 4 (03/47/42 5956)  Complications: No apparent anesthesia complications

## 2018-03-15 ENCOUNTER — Ambulatory Visit: Payer: Self-pay | Admitting: Obstetrics

## 2018-03-27 ENCOUNTER — Ambulatory Visit: Payer: Self-pay | Admitting: Obstetrics

## 2018-04-23 ENCOUNTER — Encounter: Payer: Self-pay | Admitting: Gynecologic Oncology

## 2018-04-23 ENCOUNTER — Telehealth: Payer: Self-pay | Admitting: *Deleted

## 2018-04-23 NOTE — Telephone Encounter (Signed)
Used [employer] and called the patient. The interpreter left a message for the patient to call the office to schedule a post op appt. Mailed letter to patient

## 2018-05-01 ENCOUNTER — Telehealth: Payer: Self-pay | Admitting: *Deleted

## 2018-05-01 NOTE — Telephone Encounter (Signed)
Patient called in to schedule a follow-up appointment with Dr. Gerarda Fraction. Patient is unable to speak [place], patient's daughter interpreted for the mother.    Patient's daughter states " that "my mom received a letter from the hospital stating that she needed to schedule an appointment with Dr. Gerarda Fraction".  Appointment scheduled for Thursday, May 30th at 1:45p.m.  Ask patient to arrive at 1:30p.m. Patient's daughter states that "her mother verbalizes understanding".  I called interpreter services to have a [place] interpreter present during the visit.

## 2018-05-02 ENCOUNTER — Inpatient Hospital Stay: Payer: Self-pay | Attending: Obstetrics | Admitting: Obstetrics

## 2018-05-02 ENCOUNTER — Encounter: Payer: Self-pay | Admitting: Obstetrics

## 2018-05-02 VITALS — BP 114/72 | HR 65 | Temp 98.8°F | Resp 20 | Ht 66.0 in | Wt 157.7 lb

## 2018-05-02 DIAGNOSIS — C539 Malignant neoplasm of cervix uteri, unspecified: Secondary | ICD-10-CM | POA: Insufficient documentation

## 2018-05-02 DIAGNOSIS — Z9889 Other specified postprocedural states: Secondary | ICD-10-CM | POA: Insufficient documentation

## 2018-05-02 NOTE — Progress Notes (Signed)
Progress Note: Gyn-Onc Followup  Consult was initially requested by Dr. Mora Bellman   CC: Stage 1A1 SCCa Cervix  HPI: [name] is a 27 y.o. gravida 4 para 3   Interval History: Since her last visit on 02/26/2018 she underwent a cervical conization and laparoscopic pelvic lymph node dissection.  The cervical cone specimen revealed benign ectocervical squamous mucosa no dysplasia or malignancy and it measured anterior and posteriorly 1.4 cm in greatest dimension by 0.8 x 0.5.  The bilateral pelvic lymph nodes were negative for malignancy.  Missed her initial postoperative appointment were finally able to get her in today for follow-up.  Her last period was Apr 12, 2018 but she states it was somewhat light but denies any dysmenorrhea.  She has not had another Depo-Provera injection and states she is currently using condoms for birth control.    Oncologic History: She had an intrapartum diagnosis ASCUS cannot rule out high-grade lesion on Pap testing in March 2018.  Normal spontaneous vaginal delivery May 2018. 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  ECC was negative   LEEP was performed on January 09, 2018 and was notable for a invasive squamous cell carcinoma arising in a background of high-grade squamous intraepithelial lesion.  Lesion measured 1 mm in depth where the cervix was 9 mm in thickness with the extent of cancer of 4 mm.  Of note there was a small focus suspicious for lymphovascular invasion.  The endocervical margin was negative for squamous intraepithelial lesion or carcinoma however the ectocervical margin was focally noted to have high-grade squamous intraepithelial lesion.  She stated that she had not completed her childbearing and was using Depo-Provera for birth control.       Current Meds:  Facility-Administered Encounter Medications as of 05/02/2018  Medication  . fentaNYL (SUBLIMAZE) injection 25-50 mcg  .  lactated ringers infusion  . meperidine (DEMEROL) injection 6.25-12.5 mg  . metoCLOPramide (REGLAN) injection 10 mg   Outpatient Encounter Medications as of 05/02/2018  Medication Sig  . acetaminophen (TYLENOL) 325 MG tablet Take 325 mg by mouth every 6 (six) hours as needed (for pain).  . CALCIUM PO Take 1 tablet by mouth daily.  . Cyanocobalamin (VITAMIN B-12 PO) Take 1 tablet by mouth daily.  . [DISCONTINUED] oxyCODONE-acetaminophen (PERCOCET/ROXICET) 5-325 MG tablet Take 1-2 tablets by mouth every 4 (four) hours as needed for severe pain. (Patient not taking: Reported on 05/02/2018)  . [DISCONTINUED] Prenatal Vit-Fe Fumarate-FA (MULTIVITAMIN-PRENATAL) 27-0.8 MG TABS tablet Take 1 tablet by mouth daily.     Allergy: No Known Allergies  Social Hx:   Social History   Socioeconomic History  . Marital status: Married    Spouse name: Not on file  . Number of children: Not on file  . Years of education: Not on file  . Highest education level: Not on file  Occupational History  . Not on file  Social Needs  . Financial resource strain: Not on file  . Food insecurity:    Worry: Not on file    Inability: Not on file  . Transportation needs:    Medical: Not on file    Non-medical: Not on file  Tobacco Use  . Smoking status: Never Smoker  . Smokeless tobacco: Never Used  Substance and Sexual Activity  . Alcohol use: No  . Drug use: No  . Sexual activity: Yes    Birth control/protection: Condom  Lifestyle  . Physical activity:    Days per week:  Not on file    Minutes per session: Not on file  . Stress: Not on file  Relationships  . Social connections:    Talks on phone: Not on file    Gets together: Not on file    Attends religious service: Not on file    Active member of club or organization: Not on file    Attends meetings of clubs or organizations: Not on file    Relationship status: Not on file  . Intimate partner violence:    Fear of current or ex partner: Not on file     Emotionally abused: Not on file    Physically abused: Not on file    Forced sexual activity: Not on file  Other Topics Concern  . Not on file  Social History Narrative  . Not on file    Past Surgical Hx:  Past Surgical History:  Procedure Laterality Date  . CERVICAL CONIZATION W/BX N/A 02/26/2018   Procedure: COLD KNIFE CONIZATION OF CERVIX;  Surgeon: Isabel Caprice, MD;  Location: [facility];  Service: Gynecology;  Laterality: N/A;  . DILATION AND CURETTAGE OF UTERUS    . LEEP    . ROBOTIC PELVIC AND PARA-AORTIC LYMPH NODE DISSECTION Bilateral 02/26/2018   Procedure: XI ROBOTIC BILATERAL PELVIC  LYMPH NODE DISSECTION;  Surgeon: Isabel Caprice, MD;  Location: [facility];  Service: Gynecology;  Laterality: Bilateral;    Past Medical Hx:  Past Medical History:  Diagnosis Date  . Cervical cancer [facility])     Past Gynecological History: Menarche at age 27 with regular menses last pregnancy May 2018 3 normal spontaneous vaginal deliveries for Depo-Provera for birth control denies any history of abnormal Pap prior to that in March 2018   Family Hx:  Family History  Problem Relation Age of Onset  . Hypertension Father     Review of Systems  All other systems reviewed and are negative. as in HPI   Vitals:  Blood pressure 114/72, pulse 65, temperature 98.8 F (37.1 C), temperature source Oral, resp. rate 20, height 5\' 6"  (1.676 m), weight 157 lb 11.2 oz (71.5 kg), SpO2 100 %, not currently breastfeeding.  Physical Exam: ECOG PERFORMANCE STATUS: 0 - Asymptomatic   General :  Well developed, 27 y.o., female in no apparent distress HEENT:  Normocephalic/atraumatic, symmetric, EOMI, eyelids normal Neck:   No visible masses.  Respiratory:  Respirations unlabored, no use of accessory muscles CV:   Deferred Breast:  Deferred Musculoskeletal: Normal muscle strength. Abdomen:  Laparoscopic sites are clean dry and intact and fully healed.  No visible masses or protrusion Extremities:   No visible edema or deformities Skin:   Normal inspection Neuro/Psych:  No focal motor deficit, no abnormal mental status. Normal gait. Normal affect. Alert and oriented to person, place, and time  Genito Urinary: Vulva: Normal external female genitalia.  Bladder/urethra: Urethral meatus normal in size and location. No lesions or   masses, well supported bladder Speculum exam: Vagina: No lesion, no discharge, no bleeding. Cervix: Status post conization and LEEP with deformed mostly posterior lip still with somewhat normal-appearing anterior lip.  Difficult to visualize os. Bimanual exam:  Uterus: Normal size, mobile.  Adnexa: No masses. 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 Patient was given a copy of her operative and pathology reports today 2. I discussed the importance of continued close follow-up  3. Return to clinic in 3 months until 02/2020.  Then every 6 months until 02/2023 4. Plan for annual Pap and ECC every March 5. I encouraged her to consider a birth control option at least for the first year of surveillance.  She can go to the health clinic for Depo-Provera as she had previously done or I am happy to prescribe her contraceptive.   Isabel Caprice, MD, PhD 05/02/2018, 3:46 PM  Cc: Mora Bellman, MD (Referring Ob/Gyn) Patient, No Pcp Per

## 2018-05-02 NOTE — Patient Instructions (Signed)
Plan to follow up in three months or sooner if needed.  If you want to proceed with the depo shot for birth control, you will need to go to the health department or a gyn to receive this.  If you want birth control pills, Dr. Gerarda Fraction can prescribe.

## 2018-07-19 MED ORDER — ATROPINE SULFATE 1 MG/10ML IJ SOSY
PREFILLED_SYRINGE | INTRAMUSCULAR | Status: AC
  Filled 2018-07-19: qty 10

## 2018-07-28 NOTE — Progress Notes (Signed)
[facility]   Progress Note : Established Patient FOLLOW-UP  Consult was initially requested by Dr. Mora Bellman   CC: Stage 1A1 SCCa Cervix  HPI: [name] is a 27 y.o. P3  Interval History: Since her last visit she is doing well. Denies bleeding, denies pain.  Birth control - still hasn't gone to get Depo. Using alternative methods (condoms etc)   Oncologic History: She had an intrapartum diagnosis ASCUS cannot rule out high-grade lesion on Pap testing in March 2018.  Normal spontaneous vaginal delivery May 2018. 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  ECC was negative   LEEP was performed on January 09, 2018 and was notable for a invasive squamous cell carcinoma arising in a background of high-grade squamous intraepithelial lesion.  Lesion measured 1 mm in depth where the cervix was 9 mm in thickness with the extent of cancer of 4 mm.  Of note there was a small focus suspicious for lymphovascular invasion.  The endocervical margin was negative for squamous intraepithelial lesion or carcinoma however the ectocervical margin was focally noted to have high-grade squamous intraepithelial lesion.  She stated that she had not completed her childbearing and was using Depo-Provera for birth control.    On 02/26/2018 she underwent a cervical conization and laparoscopic pelvic lymph node dissection.  The cervical cone specimen revealed benign ectocervical squamous mucosa no dysplasia or malignancy and it measured anterior and posteriorly 1.4 cm in greatest dimension by 0.8 x 0.5.  The bilateral pelvic lymph nodes were negative for malignancy.    Current Meds:  Facility-Administered Encounter Medications as of 08/02/2018  Medication  . fentaNYL (SUBLIMAZE) injection 25-50 mcg  . lactated ringers infusion  . meperidine (DEMEROL) injection 6.25-12.5 mg  . metoCLOPramide (REGLAN)  injection 10 mg   Outpatient Encounter Medications as of 08/02/2018  Medication Sig  . acetaminophen (TYLENOL) 325 MG tablet Take 325 mg by mouth every 6 (six) hours as needed (for pain).  . CALCIUM PO Take 1 tablet by mouth daily.  . Cyanocobalamin (VITAMIN B-12 PO) Take 1 tablet by mouth daily.    Allergy: No Known Allergies  Social Hx:   Social History   Socioeconomic History  . Marital status: Married    Spouse name: Not on file  . Number of children: Not on file  . Years of education: Not on file  . Highest education level: Not on file  Occupational History  . Not on file  Social Needs  . Financial resource strain: Not on file  . Food insecurity:    Worry: Not on file    Inability: Not on file  . Transportation needs:    Medical: Not on file    Non-medical: Not on file  Tobacco Use  . Smoking status: Never Smoker  . Smokeless tobacco: Never Used  Substance and Sexual Activity  . Alcohol use: No  . Drug use: No  . Sexual activity: Yes    Birth control/protection: Condom  Lifestyle  . Physical activity:    Days per week: Not on file    Minutes per session: Not on file  . Stress: Not on file  Relationships  . Social connections:    Talks on phone: Not on file    Gets together: Not on file    Attends religious service: Not on file    Active member of club or organization: Not on file    Attends meetings  of clubs or organizations: Not on file    Relationship status: Not on file  . Intimate partner violence:    Fear of current or ex partner: Not on file    Emotionally abused: Not on file    Physically abused: Not on file    Forced sexual activity: Not on file  Other Topics Concern  . Not on file  Social History Narrative  . Not on file    Past Surgical Hx:  Past Surgical History:  Procedure Laterality Date  . CERVICAL CONIZATION W/BX N/A 02/26/2018   Procedure: COLD KNIFE CONIZATION OF CERVIX;  Surgeon: Isabel Caprice, MD;  Location: [facility];  Service:  Gynecology;  Laterality: N/A;  . DILATION AND CURETTAGE OF UTERUS    . LEEP    . ROBOTIC PELVIC AND PARA-AORTIC LYMPH NODE DISSECTION Bilateral 02/26/2018   Procedure: XI ROBOTIC BILATERAL PELVIC  LYMPH NODE DISSECTION;  Surgeon: Isabel Caprice, MD;  Location: [facility];  Service: Gynecology;  Laterality: Bilateral;    Past Medical Hx:  Past Medical History:  Diagnosis Date  . Cervical cancer [facility])     Past Gynecological History: Menarche at age 35 with regular menses last pregnancy May 2018 3 normal spontaneous vaginal deliveries for Depo-Provera for birth control denies any history of abnormal Pap prior to that in March 2018   Family Hx:  Family History  Problem Relation Age of Onset  . Hypertension Father     Review of Systems  All other systems reviewed and are negative. as in HPI  Vitals:  Blood pressure 122/68, pulse 84, temperature 98.7 F (37.1 C), temperature source Oral, resp. rate 18, height 5\' 6"  (1.676 m), weight 166 lb 11.2 oz (75.6 kg), SpO2 100 %, not currently breastfeeding.  Physical Exam: ECOG PERFORMANCE STATUS: 0 - Asymptomatic   General :  Well developed, 27 y.o., female in no apparent distress HEENT:  Normocephalic/atraumatic, symmetric, EOMI, eyelids normal Neck:   No visible masses.  Respiratory:  Respirations unlabored, no use of accessory muscles CV:   Deferred Breast:  Deferred Musculoskeletal: Normal muscle strength. Abdomen:  Laparoscopic sites are clean dry and intact and fully healed.  No visible masses or protrusion Extremities:  No visible edema or deformities Skin:   Normal inspection Neuro/Psych:  No focal motor deficit, no abnormal mental status. Normal gait. Normal affect. Alert and oriented to person, place, and time  Genito Urinary: Vulva: Normal external female genitalia.  Bladder/urethra: Urethral meatus normal in size and location. No lesions or   masses, well supported bladder Speculum exam: Vagina: No lesion, no discharge, no  bleeding. Cervix: Status post conization and LEEP with deformed mostly posterior lip still with somewhat normal-appearing anterior lip.  Difficult to visualize os. Bimanual exam:  Uterus: Normal size, mobile.  Adnexa: No masses. 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 Return to clinic Q3 months until 02/2020.  Then every 6 months until 02/2023 2. Plan for annual Pap and ECC every March 3. I encouraged her continue birth control at least for the first year of surveillance.  She can go to the health clinic for Depo-Provera as she had previously done or I am happy to prescribe her contraceptive.   Face to face time with patient was 15 minutes. Over 50% of this time was spent on counseling and coordination of care.   [place] B  Gerarda Fraction, MD, PhD 08/02/2018, 11:09 AM  Cc: Mora Bellman, MD (Referring Ob/Gyn) Patient, No Pcp Per

## 2018-08-02 ENCOUNTER — Inpatient Hospital Stay: Payer: Self-pay | Attending: Obstetrics | Admitting: Obstetrics

## 2018-08-02 ENCOUNTER — Encounter: Payer: Self-pay | Admitting: Obstetrics

## 2018-08-02 VITALS — BP 122/68 | HR 84 | Temp 98.7°F | Resp 18 | Ht 66.0 in | Wt 166.7 lb

## 2018-08-02 DIAGNOSIS — C539 Malignant neoplasm of cervix uteri, unspecified: Secondary | ICD-10-CM | POA: Insufficient documentation

## 2018-08-02 NOTE — Patient Instructions (Signed)
1. Return in 3 months for another exam. 2. Continue birth control method of your choice

## 2018-11-03 NOTE — Progress Notes (Addendum)
Progress Note : Established Patient FOLLOW-UP  Consult was initially requested by Dr. Mora Bellman   CC: Stage 1A1 SCCa Cervix  02/2018 fertility sparing conization and PLND  HPI: [name] is a 27 y.o. P3  Interval History: Since her last visit she is doing well. Denies pain. Notes her menstrual cycle has lightened significantly. Has some hot flashes. Notes numbness on medial thighs  Birth control - Does not plan to get Depo. Using alternative methods (condoms etc).   Oncologic History: She had an intrapartum diagnosis ASCUS cannot rule out high-grade lesion on Pap testing in March 2018.  Normal spontaneous vaginal delivery May 2018. 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  ECC was negative   LEEP was performed on January 09, 2018 and was notable for a invasive squamous cell carcinoma arising in a background of high-grade squamous intraepithelial lesion.  Lesion measured 1 mm in depth where the cervix was 9 mm in thickness with the extent of cancer of 4 mm.  Of note there was a small focus suspicious for lymphovascular invasion.  The endocervical margin was negative for squamous intraepithelial lesion or carcinoma however the ectocervical margin was focally noted to have high-grade squamous intraepithelial lesion.  She stated that she had not completed her childbearing and was using Depo-Provera for birth control.    On 02/26/2018 she underwent a cervical conization and laparoscopic pelvic lymph node dissection.  The cervical cone specimen revealed benign ectocervical squamous mucosa no dysplasia or malignancy and it measured anterior and posteriorly 1.4 cm in greatest dimension by 0.8 x 0.5.  The bilateral pelvic lymph nodes were negative for malignancy.    Current Meds:  Outpatient Encounter Medications as of 11/04/2018  Medication Sig  . acetaminophen (TYLENOL) 325 MG tablet Take 325 mg by mouth every 6 (six) hours as needed  (for pain).  . CALCIUM PO Take 1 tablet by mouth daily.  . Cyanocobalamin (VITAMIN B-12 PO) Take 1 tablet by mouth daily.   No facility-administered encounter medications on file as of 11/04/2018.     Allergy: No Known Allergies  Social Hx:   Social History   Socioeconomic History  . Marital status: Married    Spouse name: Not on file  . Number of children: Not on file  . Years of education: Not on file  . Highest education level: Not on file  Occupational History  . Not on file  Social Needs  . Financial resource strain: Not on file  . Food insecurity:    Worry: Not on file    Inability: Not on file  . Transportation needs:    Medical: Not on file    Non-medical: Not on file  Tobacco Use  . Smoking status: Never Smoker  . Smokeless tobacco: Never Used  Substance and Sexual Activity  . Alcohol use: No  . Drug use: No  . Sexual activity: Yes    Birth control/protection: Condom  Lifestyle  . Physical activity:    Days per week: Not on file    Minutes per session: Not on file  . Stress: Not on file  Relationships  . Social connections:    Talks on phone: Not on file    Gets together: Not on file    Attends religious service: Not on file    Active member of club or organization: Not on file    Attends meetings of clubs or organizations: Not on file    Relationship status: Not on file  .  Intimate partner violence:    Fear of current or ex partner: Not on file    Emotionally abused: Not on file    Physically abused: Not on file    Forced sexual activity: Not on file  Other Topics Concern  . Not on file  Social History Narrative  . Not on file    Past Surgical Hx:  Past Surgical History:  Procedure Laterality Date  . CERVICAL CONIZATION W/BX N/A 02/26/2018   Procedure: COLD KNIFE CONIZATION OF CERVIX;  Surgeon: Isabel Caprice, MD;  Location: [facility];  Service: Gynecology;  Laterality: N/A;  . DILATION AND CURETTAGE OF UTERUS    . LEEP    . ROBOTIC PELVIC AND  PARA-AORTIC LYMPH NODE DISSECTION Bilateral 02/26/2018   Procedure: XI ROBOTIC BILATERAL PELVIC  LYMPH NODE DISSECTION;  Surgeon: Isabel Caprice, MD;  Location: [facility];  Service: Gynecology;  Laterality: Bilateral;    Past Medical Hx:  Past Medical History:  Diagnosis Date  . Cervical cancer [facility])     Past Gynecological History: Menarche at age 30 with regular menses last pregnancy May 2018 3 normal spontaneous vaginal deliveries for Depo-Provera for birth control denies any history of abnormal Pap prior to that in March 2018   Family Hx:  Family History  Problem Relation Age of Onset  . Hypertension Father     Review of Systems  Endocrine: Positive for hot flashes.  Neurological: Positive for headaches.  All other systems reviewed and are negative.   Vitals:  Blood pressure 126/73, pulse 73, temperature 98.6 F (37 C), temperature source Oral, resp. rate 19, height 5\' 6"  (1.676 m), weight 170 lb 4.8 oz (77.2 kg), SpO2 100 %, not currently breastfeeding.  Physical Exam: ECOG PERFORMANCE STATUS: 0 - Asymptomatic   General :  Well developed, 27 y.o., female in no apparent distress HEENT:  Normocephalic/atraumatic, symmetric, EOMI, eyelids normal Neck:   No visible masses.  Respiratory:  Respirations unlabored, no use of accessory muscles CV:   Deferred Breast:  Deferred Musculoskeletal: Normal muscle strength. Abdomen:  Laparoscopic sites are clean dry and intact and fully healed.  No visible masses or protrusion Extremities:  No visible edema or deformities Skin:   Normal inspection Neuro/Psych:  No focal motor deficit, no abnormal mental status. Normal gait. Normal affect. Alert and oriented to person, place, and time  Genito Urinary: Vulva: Normal external female genitalia.  Bladder/urethra: Urethral meatus normal in size and location. No lesions or   masses, well supported bladder Speculum exam: Vagina: No lesion, no discharge, no bleeding. Cervix: Status post  conization and LEEP with difficulty identifying posterior lip still with somewhat normal-appearing anterior lip.  Difficult to visualize os. Bimanual exam:  Uterus: Normal size, mobile.  Adnexa: No masses. 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 Return to clinic Q3 months until 02/2020.  Then every 6 months until 02/2023 2. Plan for annual Pap and ECC every March 3. I encouraged her continue birth control at least for the first year of surveillance.  I offered oral contraceptive to help determine if that would regulate her cycle better.  4. We discussed possible cervical stenosis playing a role. I will order a TVUS to followup and be sure no hematometra. She denies dysmenorrhea.   Face to face time with patient was 15 minutes. Over 50% of this time was spent on  counseling and coordination of care.   Isabel Caprice, MD, PhD 11/04/2018, 11:35 AM  Cc: Mora Bellman, MD (Referring Ob/Gyn) Patient, No Pcp Per

## 2018-11-04 ENCOUNTER — Inpatient Hospital Stay: Payer: Self-pay | Attending: Obstetrics | Admitting: Obstetrics

## 2018-11-04 ENCOUNTER — Encounter: Payer: Self-pay | Admitting: Obstetrics

## 2018-11-04 VITALS — BP 126/73 | HR 73 | Temp 98.6°F | Resp 19 | Ht 66.0 in | Wt 170.3 lb

## 2018-11-04 DIAGNOSIS — C539 Malignant neoplasm of cervix uteri, unspecified: Secondary | ICD-10-CM

## 2018-11-04 NOTE — Patient Instructions (Signed)
   Return in 3 months for exam and pap  Ultrasound to image your uterus

## 2018-11-07 ENCOUNTER — Ambulatory Visit (HOSPITAL_COMMUNITY)
Admission: RE | Admit: 2018-11-07 | Discharge: 2018-11-07 | Disposition: A | Discharge status: Home / Self Care | Payer: Self-pay | Source: Ambulatory Visit | Attending: Obstetrics | Admitting: Obstetrics

## 2018-11-07 DIAGNOSIS — C539 Malignant neoplasm of cervix uteri, unspecified: Secondary | ICD-10-CM | POA: Insufficient documentation

## 2018-11-11 ENCOUNTER — Telehealth: Payer: Self-pay

## 2018-11-11 NOTE — Telephone Encounter (Signed)
Told Ms Beltran-Gallegos that the [place] was normal and the changes seen are related to the recent conization procedure done per Joylene John, NP.

## 2019-01-22 ENCOUNTER — Telehealth: Payer: Self-pay | Admitting: *Deleted

## 2019-01-22 NOTE — Telephone Encounter (Signed)
Called and left the patient a message to call the office back. Need to move the patient's appt from 3/2 with Dr. Gerarda Fraction to 3/5 with Dr. Skeet Latch.

## 2019-01-23 ENCOUNTER — Telehealth: Payer: Self-pay | Admitting: *Deleted

## 2019-01-23 NOTE — Telephone Encounter (Addendum)
Patient's daughter called and moved the appt from 3/2 to 3/5 with Dr. Skeet Latch. Patient requested female provider

## 2019-01-23 NOTE — Telephone Encounter (Signed)
Called the patient using Kim Sanders and left the patient a message to call the office back. Patient need to me be from Dr. Gerarda Fraction to Dr. Skeet Latch.

## 2019-02-03 ENCOUNTER — Ambulatory Visit: Payer: Self-pay | Admitting: Obstetrics

## 2019-02-06 ENCOUNTER — Encounter: Payer: Self-pay | Admitting: Gynecologic Oncology

## 2019-02-06 ENCOUNTER — Inpatient Hospital Stay: Payer: Self-pay | Attending: Obstetrics | Admitting: Gynecologic Oncology

## 2019-02-06 ENCOUNTER — Other Ambulatory Visit (HOSPITAL_COMMUNITY)
Admission: RE | Admit: 2019-02-06 | Discharge: 2019-02-06 | Disposition: A | Discharge status: Home / Self Care | Payer: Self-pay | Source: Ambulatory Visit | Attending: Gynecologic Oncology | Admitting: Gynecologic Oncology

## 2019-02-06 ENCOUNTER — Other Ambulatory Visit: Payer: Self-pay

## 2019-02-06 VITALS — BP 117/72 | HR 74 | Temp 98.7°F | Resp 18 | Ht 66.0 in | Wt 174.0 lb

## 2019-02-06 DIAGNOSIS — C539 Malignant neoplasm of cervix uteri, unspecified: Secondary | ICD-10-CM | POA: Insufficient documentation

## 2019-02-06 DIAGNOSIS — R87613 High grade squamous intraepithelial lesion on cytologic smear of cervix (HGSIL): Secondary | ICD-10-CM | POA: Insufficient documentation

## 2019-02-06 NOTE — Patient Instructions (Signed)
We will call you with the results of the pap Smear and biopsy when available. Follow up with Dr. Skeet Latch in 3 months as scheduled.

## 2019-02-06 NOTE — Progress Notes (Signed)
Progress Note : Established Patient FOLLOW-UP  Consult was initially requested by Dr. Mora Bellman   CC: Stage 1A1 SCCa Cervix  02/2018 fertility sparing conization and PLND  HPI: Kim Sanders is a 28 y.o. P3    Cervical cancer ([place])   02/26/2018 Initial Diagnosis    Cervical cancer ([place])        Current Meds:  Outpatient Encounter Medications as of 02/06/2019  Medication Sig  . acetaminophen (TYLENOL) 325 MG tablet Take 325 mg by mouth every 6 (six) hours as needed (for pain).  . CALCIUM PO Take 1 tablet by mouth daily.  . Cyanocobalamin (VITAMIN B-12 PO) Take 1 tablet by mouth daily.   No facility-administered encounter medications on file as of 02/06/2019.     Allergy: No Known Allergies  Social Hx:   Social History   Socioeconomic History  . Marital status: Married    Spouse name: Not on file  . Number of children: Not on file  . Years of education: Not on file  . Highest education level: Not on file  Occupational History  . Not on file  Social Needs  . Financial resource strain: Not on file  . Food insecurity:    Worry: Not on file    Inability: Not on file  . Transportation needs:    Medical: Not on file    Non-medical: Not on file  Tobacco Use  . Smoking status: Never Smoker  . Smokeless tobacco: Never Used  Substance and Sexual Activity  . Alcohol use: No  . Drug use: No  . Sexual activity: Yes    Birth control/protection: Condom  Lifestyle  . Physical activity:    Days per week: Not on file    Minutes per session: Not on file  . Stress: Not on file  Relationships  . Social connections:    Talks on phone: Not on file    Gets together: Not on file    Attends religious service: Not on file    Active member of club or organization: Not on file    Attends meetings of clubs or organizations: Not on file    Relationship status: Not on file  . Intimate partner violence:    Fear of current or ex partner: Not on file    Emotionally  abused: Not on file    Physically abused: Not on file    Forced sexual activity: Not on file  Other Topics Concern  . Not on file  Social History Narrative  . Not on file    Past Surgical Hx:  Past Surgical History:  Procedure Laterality Date  . CERVICAL CONIZATION W/BX N/A 02/26/2018   Procedure: COLD KNIFE CONIZATION OF CERVIX;  Surgeon: Isabel Caprice, MD;  Location: [facility];  Service: Gynecology;  Laterality: N/A;  . DILATION AND CURETTAGE OF UTERUS    . LEEP    . ROBOTIC PELVIC AND PARA-AORTIC LYMPH NODE DISSECTION Bilateral 02/26/2018   Procedure: XI ROBOTIC BILATERAL PELVIC  LYMPH NODE DISSECTION;  Surgeon: Isabel Caprice, MD;  Location: [facility];  Service: Gynecology;  Laterality: Bilateral;    Past Medical Hx:  Past Medical History:  Diagnosis Date  . Cervical cancer [facility])     Past Gynecological History: Menarche at age 53 with regular menses last pregnancy May 2018 3 normal spontaneous vaginal deliveries.  Is using condoms for birth control. Denies any history of abnormal Pap prior to that in March 2018   Family Hx:  Family History  Problem Relation Age of Onset  . Hypertension Father     Review of Systems  Constitutional: Negative.   HENT:  Negative.   Eyes: Negative.   Respiratory: Negative.   Cardiovascular: Negative.   Gastrointestinal: Negative.   Genitourinary: Negative.    Musculoskeletal: Negative.   Psychiatric/Behavioral: Negative.   All other systems reviewed and are negative.   Vitals:  Blood pressure 117/72, pulse 74, temperature 98.7 F (37.1 C), temperature source Oral, resp. rate 18, height 5\' 6"  (1.676 m), weight 174 lb (78.9 kg), SpO2 100 %, not currently breastfeeding.  Physical Exam: ECOG PERFORMANCE STATUS: 0 - Asymptomatic   General :  Well developed, 28 y.o., female in no apparent distress HEENT:  Normocephalic/atraumatic, symmetric, EOMI, eyelids normal Neck:   No visible masses.  Respiratory:  Respirations unlabored, no use of  accessory muscles Musculoskeletal: Normal muscle strength. Abdomen:  Laparoscopic sites are clean dry and intact and fully healed.  No visible masses or protrusion Extremities:  No visible edema or deformities Skin:   Normal inspection Neuro/Psych:  No focal motor deficit, no abnormal mental status. Normal gait. Normal affect. Alert and oriented to person, place, and time  Genito Urinary: Vulva: Normal external female genitalia.  Bladder/urethra: Urethral meatus normal in size and location. No lesions or   masses, well supported bladder Speculum exam: Vagina: No lesion, no discharge, no bleeding. Cervix: Status post conization and LEEP with difficulty identifying posterior lip still with somewhat normal-appearing anterior lip.  Pap ECC collected, with small amount of bleeding. Bimanual exam:  Uterus: Normal size, mobile.  Adnexa: No masses. Rectovaginal:  Ext Hemorrhoid  No uterosacral or parametrial nodularity.    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 Return to clinic Q3 months until 02/2020.  Then every 6 months until 02/2023 2. Plan for annual Pap and ECC every March 3. Counseled on the signs and symptoms of recurrence.   Scant menses monthly She denies dysmenorrhea.  Pelvic UTZ 11/2018 without evidence of hematometra.  Will repeat UTZ  At next visit     [name], MD, PhD 02/06/2019, 10:49 AM  Cc: Mora Bellman, MD (Referring Ob/Gyn) Patient, No Pcp Per

## 2019-02-12 LAB — CYTOLOGY - PAP
DIAGNOSIS: NEGATIVE
HPV (WINDOPATH): NOT DETECTED

## 2019-02-13 ENCOUNTER — Telehealth: Payer: Self-pay

## 2019-02-13 NOTE — Telephone Encounter (Signed)
Used #[id number] [place] Beck interpreter - outgoing call to patient with Pap results as normal and biopsy results as 'healing tissue' per Joylene John NP- pt voiced understanding. No other needs per her at this time.

## 2019-05-13 NOTE — Progress Notes (Deleted)
Progress Note : Established Patient FOLLOW-UP  Consult was initially requested by Dr. Mora Bellman   CC: Stage 1A1 SCCa Cervix  02/2018 fertility sparing conization and PLND  HPI: [name] is a 28 y.o. P3  Oncology History   Kim Sanders  had an intrapartum diagnosis ASCUS cannot rule out high-grade lesion on Pap testing in March 2018.  Normal spontaneous vaginal delivery May 2018. 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  ECC was negative   LEEP was performed on January 09, 2018     Cervical cancer [facility])   01/2018 Surgery    LEEP  Ivasive squamous cell carcinoma arising in a background of high-grade squamous intraepithelial lesion.  Lesion measured 1 mm in depth where the cervix was 9 mm in thickness with the extent of cancer of 4 mm.  Of note there was a small focus suspicious for lymphovascular invasion.  The endocervical margin was negative for squamous intraepithelial lesion or carcinoma however the ectocervical margin was focally noted to have high-grade squamous intraepithelial lesion     02/26/2018 Initial Diagnosis    Cervical cancer (HCC)    02/26/2018 Surgery    Cervical Cold Knife Cone , BPLND   The cervical cone specimen revealed benign ectocervical squamous mucosa no dysplasia or malignancy and it measured anterior and posteriorly 1.4 cm in greatest dimension by 0.8 x 0.5.  The bilateral pelvic lymph nodes were negative for malignancy.    02/06/2019 Pathology Results    Pap ECC Pap wnl ECC Granulation tissue        Current Meds:  Outpatient Encounter Medications as of 05/15/2019  Medication Sig  . acetaminophen (TYLENOL) 325 MG tablet Take 325 mg by mouth every 6 (six) hours as needed (for pain).  . CALCIUM PO Take 1 tablet by mouth daily.  . Cyanocobalamin (VITAMIN B-12 PO) Take 1 tablet by mouth daily.   No facility-administered encounter medications on file as of 05/15/2019.      Allergy: No Known Allergies  Social Hx:   Social History   Socioeconomic History  . Marital status: Married    Spouse name: Not on file  . Number of children: Not on file  . Years of education: Not on file  . Highest education level: Not on file  Occupational History  . Not on file  Social Needs  . Financial resource strain: Not on file  . Food insecurity:    Worry: Not on file    Inability: Not on file  . Transportation needs:    Medical: Not on file    Non-medical: Not on file  Tobacco Use  . Smoking status: Never Smoker  . Smokeless tobacco: Never Used  Substance and Sexual Activity  . Alcohol use: No  . Drug use: No  . Sexual activity: Yes    Birth control/protection: Condom  Lifestyle  . Physical activity:    Days per week: Not on file    Minutes per session: Not on file  . Stress: Not on file  Relationships  . Social connections:    Talks on phone: Not on file    Gets together: Not on file    Attends religious service: Not on file    Active member of club or organization: Not on file    Attends meetings of clubs or organizations: Not on file    Relationship status: Not on file  . Intimate partner violence:    Fear of current or ex partner: Not  on file    Emotionally abused: Not on file    Physically abused: Not on file    Forced sexual activity: Not on file  Other Topics Concern  . Not on file  Social History Narrative  . Not on file    Past Surgical Hx:  Past Surgical History:  Procedure Laterality Date  . CERVICAL CONIZATION W/BX N/A 02/26/2018   Procedure: COLD KNIFE CONIZATION OF CERVIX;  Surgeon: Isabel Caprice, MD;  Location: [facility];  Service: Gynecology;  Laterality: N/A;  . DILATION AND CURETTAGE OF UTERUS    . LEEP    . ROBOTIC PELVIC AND PARA-AORTIC LYMPH NODE DISSECTION Bilateral 02/26/2018   Procedure: XI ROBOTIC BILATERAL PELVIC  LYMPH NODE DISSECTION;  Surgeon: Isabel Caprice, MD;  Location: [facility];  Service: Gynecology;   Laterality: Bilateral;    Past Medical Hx:  Past Medical History:  Diagnosis Date  . Cervical cancer [facility])     Past Gynecological History: Menarche at age 55 with regular menses last pregnancy May 2018 3 normal spontaneous vaginal deliveries.  Is using condoms for birth control. Denies any history of abnormal Pap prior to that in March 2018   Family Hx:  Family History  Problem Relation Age of Onset  . Hypertension Father     Review of Systems  Constitutional: Negative.   HENT:  Negative.   Eyes: Negative.   Respiratory: Negative.   Cardiovascular: Negative.   Gastrointestinal: Negative.   Genitourinary: Negative.    Musculoskeletal: Negative.   Psychiatric/Behavioral: Negative.   All other systems reviewed and are negative.   Vitals:  There were no vitals taken for this visit.  Physical Exam: ECOG PERFORMANCE STATUS: 0 - Asymptomatic   General :  Well developed, 28 y.o., female in no apparent distress HEENT:  Normocephalic/atraumatic, symmetric, EOMI, eyelids normal Neck:   No visible masses.  Respiratory:  Respirations unlabored, no use of accessory muscles Musculoskeletal: Normal muscle strength. Abdomen:  Laparoscopic sites are clean dry and intact and fully healed.  No visible masses or protrusion Extremities:  No visible edema or deformities Skin:   Normal inspection Neuro/Psych:  No focal motor deficit, no abnormal mental status. Normal gait. Normal affect. Alert and oriented to person, place, and time  Genito Urinary: Vulva: Normal external female genitalia.  Bladder/urethra: Urethral meatus normal in size and location. No lesions or   masses, well supported bladder Speculum exam: Vagina: No lesion, no discharge, no bleeding. Cervix: Status post conization and LEEP with difficulty identifying posterior lip still with somewhat normal-appearing anterior lip.  Pap ECC collected, with small amount of bleeding. Bimanual exam:  Uterus: Normal size,  mobile.  Adnexa: No masses. Rectovaginal:  Ext Hemorrhoid  No uterosacral or parametrial nodularity.    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 Return to clinic Q3 months until 02/2020.  Then every 6 months until 02/2023 2. Plan for annual Pap and ECC every March 3. Counseled on the signs and symptoms of recurrence.   Scant menses monthly She denies dysmenorrhea.  Pelvic UTZ 11/2018 without evidence of hematometra.  Will repeat UTZ  At next visit     [name], MD, PhD 05/13/2019, 3:52 PM  Cc: Mora Bellman, MD (Referring Ob/Gyn) Patient, No Pcp Per

## 2019-05-15 ENCOUNTER — Inpatient Hospital Stay: Payer: Self-pay | Attending: Obstetrics | Admitting: Gynecologic Oncology

## 2019-05-27 ENCOUNTER — Telehealth: Payer: Self-pay | Admitting: *Deleted

## 2019-05-27 NOTE — Telephone Encounter (Signed)
Returned the patient's call and explained that we would call her back with an appt soon

## 2019-07-23 NOTE — Progress Notes (Signed)
Progress Note : Established Patient FOLLOW-UP  Referring Physician: Dr. Mora Bellman   CC: Stage 1A1 SCCa Cervix  02/2018 fertility sparing conization and PLND  HPI: [name] is a 28 y.o. P3  Oncology History Overview Note  Kim Sanders  had an intrapartum diagnosis ASCUS cannot rule out high-grade lesion on Pap testing in March 2018.  Normal spontaneous vaginal delivery May 2018. 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  ECC was negative   LEEP was performed on January 09, 2018   Cervical cancer [facility])  01/2018 Surgery   LEEP  Ivasive squamous cell carcinoma arising in a background of high-grade squamous intraepithelial lesion.  Lesion measured 1 mm in depth where the cervix was 9 mm in thickness with the extent of cancer of 4 mm.  Of note there was a small focus suspicious for lymphovascular invasion.  The endocervical margin was negative for squamous intraepithelial lesion or carcinoma however the ectocervical margin was focally noted to have high-grade squamous intraepithelial lesion    02/26/2018 Initial Diagnosis   Cervical cancer (HCC)   02/26/2018 Surgery   Cervical Cold Knife Cone , BPLND   The cervical cone specimen revealed benign ectocervical squamous mucosa no dysplasia or malignancy and it measured anterior and posteriorly 1.4 cm in greatest dimension by 0.8 x 0.5.  The bilateral pelvic lymph nodes were negative for malignancy.   02/06/2019 Pathology Results   Pap ECC Pap wnl ECC Granulation tissue    Reports regular menses.  LMP 07/21/2019    Current Meds:  Outpatient Encounter Medications as of 07/24/2019  Medication Sig  . acetaminophen (TYLENOL) 325 MG tablet Take 325 mg by mouth every 6 (six) hours as needed (for pain).  . CALCIUM PO Take 1 tablet by mouth daily.  . Cyanocobalamin (VITAMIN B-12 PO) Take 1 tablet by mouth daily.   No facility-administered encounter medications on file as of  07/24/2019.     Allergy: No Known Allergies  Social Hx:   Social History   Socioeconomic History  . Marital status: Married    Spouse name: Not on file  . Number of children: Not on file  . Years of education: Not on file  . Highest education level: Not on file  Occupational History  . Not on file  Social Needs  . Financial resource strain: Not on file  . Food insecurity    Worry: Not on file    Inability: Not on file  . Transportation needs    Medical: Not on file    Non-medical: Not on file  Tobacco Use  . Smoking status: Never Smoker  . Smokeless tobacco: Never Used  Substance and Sexual Activity  . Alcohol use: No  . Drug use: No  . Sexual activity: Yes    Birth control/protection: Condom  Lifestyle  . Physical activity    Days per week: Not on file    Minutes per session: Not on file  . Stress: Not on file  Relationships  . Social Herbalist on phone: Not on file    Gets together: Not on file    Attends religious service: Not on file    Active member of club or organization: Not on file    Attends meetings of clubs or organizations: Not on file    Relationship status: Not on file  . Intimate partner violence    Fear of current or ex partner: Not on file    Emotionally abused:  Not on file    Physically abused: Not on file    Forced sexual activity: Not on file  Other Topics Concern  . Not on file  Social History Narrative  . Not on file    Past Surgical Hx:  Past Surgical History:  Procedure Laterality Date  . CERVICAL CONIZATION W/BX N/A 02/26/2018   Procedure: COLD KNIFE CONIZATION OF CERVIX;  Surgeon: Isabel Caprice, MD;  Location: [facility];  Service: Gynecology;  Laterality: N/A;  . DILATION AND CURETTAGE OF UTERUS    . LEEP    . ROBOTIC PELVIC AND PARA-AORTIC LYMPH NODE DISSECTION Bilateral 02/26/2018   Procedure: XI ROBOTIC BILATERAL PELVIC  LYMPH NODE DISSECTION;  Surgeon: Isabel Caprice, MD;  Location: [facility];  Service: Gynecology;   Laterality: Bilateral;    Past Medical Hx:  Past Medical History:  Diagnosis Date  . Cervical cancer [facility])     Past Gynecological History: Menarche at age 22 with regular menses last pregnancy May 2018 3 normal spontaneous vaginal deliveries.  Is using condoms for birth control. Denies any history of abnormal Pap prior to that in March 2018   Family Hx:  Family History  Problem Relation Age of Onset  . Hypertension Father     Review of Systems  Constitutional: Negative.   HENT:  Negative.   Eyes: Negative.   Respiratory: Negative.   Cardiovascular: Negative.   Gastrointestinal: Negative.   Genitourinary: Negative.    Musculoskeletal: Negative.   Neurological: Negative.   Psychiatric/Behavioral: Negative.   All other systems reviewed and are negative.   Vitals:  Blood pressure 110/75, pulse 67, temperature 98.7 F (37.1 C), temperature source Oral, resp. rate 18, height 5\' 6"  (1.676 m), weight 176 lb 8 oz (80.1 kg), SpO2 (!) 67 %.  Physical Exam: ECOG PERFORMANCE STATUS: 0 - Asymptomatic   General :  Well developed, 28 y.o., female in no apparent distress HEENT:  Normocephalic/atraumatic, symmetric, EOMI, eyelids normal Neck:   No visible masses.  Respiratory:  Respirations unlabored, no use of accessory muscles Musculoskeletal: Normal muscle strength. Abdomen:  Laparoscopic sites are clean dry and intact and fully healed.  No visible masses or protrusion Extremities:  No visible edema or deformities Skin:   Normal inspection Neuro/Psych:  No focal motor deficit, no abnormal mental status. Normal gait. Normal affect. Alert and oriented to person, place, and time  Genito Urinary: Vulva: Normal external female genitalia.  Bladder/urethra: Urethral meatus normal in size and location. No lesions or   masses, well supported bladder Speculum exam: Vagina: No lesion, no discharge, no bleeding. Cervix: Status post conization and LEEP with difficulty identifying posterior  lip still with normal-appearing anterior lip. Blood present in the vault and cervix because of menses.   ECC collected, Bimanual exam:  Uterus: Normal size, mobile.  Adnexa: No masses. Rectovaginal:  Ext Hemorrhoid  No uterosacral or parametrial nodularity.   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 Return to clinic Q3 months until 02/2020.  Then every 6 months until 02/2023 2. Plan for annual Pap and ECC every March 3, ECC collected today since prior specimen is c/w granulation tissue  Cc: Mora Bellman, MD (Referring Ob/Gyn) Patient, No Pcp Per

## 2019-07-24 ENCOUNTER — Inpatient Hospital Stay: Payer: Self-pay | Attending: Obstetrics | Admitting: Gynecologic Oncology

## 2019-07-24 ENCOUNTER — Other Ambulatory Visit: Payer: Self-pay

## 2019-07-24 ENCOUNTER — Other Ambulatory Visit: Payer: Self-pay | Admitting: Oncology

## 2019-07-24 VITALS — BP 110/75 | HR 67 | Temp 98.7°F | Resp 18 | Ht 66.0 in | Wt 176.5 lb

## 2019-07-24 DIAGNOSIS — C530 Malignant neoplasm of endocervix: Secondary | ICD-10-CM

## 2019-07-24 DIAGNOSIS — C539 Malignant neoplasm of cervix uteri, unspecified: Secondary | ICD-10-CM | POA: Insufficient documentation

## 2019-07-24 NOTE — Addendum Note (Signed)
Addended by: Ihor Gully A on: 07/24/2019 04:16 PM   Modules accepted: Orders

## 2019-07-24 NOTE — Patient Instructions (Signed)
  Follow-up in 3 months Thank you very much Kim Sanders for allowing me to provide care for you today.  I appreciate your confidence in choosing our Gynecologic Oncology team.  If you have any questions about your visit today please call our office and we will get back to you as soon as possible.  Please consider using the website Medlineplus.gov as an Geneticist, molecular.   Francetta Found. Deuntae Kocsis MD., PhD Gynecologic Oncology

## 2019-07-28 ENCOUNTER — Telehealth: Payer: Self-pay

## 2019-07-28 NOTE — Telephone Encounter (Signed)
I spoke with Ms [facility]. I told her the ECC results were negative for cancer. Pt verbalized understanding.

## 2019-09-10 NOTE — Progress Notes (Deleted)
Office Visit  Note: Gyn-Onc   Kim Sanders 28 y.o. female   - has she gotten the HPV vaccine?    CC: Stage 1A1 SCCa Cervix 02/2018 fertility sparing conization and PLND  Assessment/Plan:  This is a 28 y.o. 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 Return to clinic Q3 months until 02/2020.  Then every 6 months until 02/2023 2. Plan for annual Pap and ECC every March    Lafonda Mosses, MD., PhD. 09/13/2019, 12:00 PM   HPI: ***  Interval History: ***  Oncology History Overview Note  Kim Sanders  had an intrapartum diagnosis ASCUS cannot rule out high-grade lesion on Pap testing in March 2018.  Normal spontaneous vaginal delivery May 2018. 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  ECC was negative   LEEP was performed on January 09, 2018   Cervical cancer [facility])  01/2018 Surgery   LEEP  Ivasive squamous cell carcinoma arising in a background of high-grade squamous intraepithelial lesion.  Lesion measured 1 mm in depth where the cervix was 9 mm in thickness with the extent of cancer of 4 mm.  Of note there was a small focus suspicious for lymphovascular invasion.  The endocervical margin was negative for squamous intraepithelial lesion or carcinoma however the ectocervical margin was focally noted to have high-grade squamous intraepithelial lesion    02/26/2018 Initial Diagnosis   Cervical cancer (HCC)   02/26/2018 Surgery   Cervical Cold Knife Cone , BPLND   The cervical cone specimen revealed benign ectocervical squamous mucosa no dysplasia or malignancy and it measured anterior and posteriorly 1.4 cm in greatest dimension by 0.8 x 0.5.  The bilateral pelvic lymph nodes were negative for malignancy.   02/06/2019 Pathology Results   Pap ECC Pap wnl ECC Granulation tissue   07/24/2019  Pathology Results   ECC Scant mucoinflammatory debris, benign squamous epithelium, no malignancy      Social Hx:   Social History   Socioeconomic History  . Marital status: Married    Spouse name: Not on file  . Number of children: Not on file  . Years of education: Not on file  . Highest education level: Not on file  Occupational History  . Not on file  Social Needs  . Financial resource strain: Not on file  . Food insecurity    Worry: Not on file    Inability: Not on file  . Transportation needs    Medical: Not on file    Non-medical: Not on file  Tobacco Use  . Smoking status: Never Smoker  . Smokeless tobacco: Never Used  Substance and Sexual Activity  . Alcohol use: No  . Drug use: No  . Sexual activity: Yes    Birth control/protection: Condom  Lifestyle  . Physical activity    Days per week: Not on file    Minutes per session: Not on file  . Stress: Not on file  Relationships  . Social Herbalist on phone: Not on file    Gets together: Not on file    Attends religious service: Not on file    Active member of club or organization: Not on file    Attends meetings of clubs or organizations: Not on file    Relationship status: Not on file  . Intimate partner violence    Fear  of current or ex partner: Not on file    Emotionally abused: Not on file    Physically abused: Not on file    Forced sexual activity: Not on file  Other Topics Concern  . Not on file  Social History Narrative  . Not on file    Past Surgical Hx:  Past Surgical History:  Procedure Laterality Date  . CERVICAL CONIZATION W/BX N/A 02/26/2018   Procedure: COLD KNIFE CONIZATION OF CERVIX;  Surgeon: Isabel Caprice, MD;  Location: [facility];  Service: Gynecology;  Laterality: N/A;  . DILATION AND CURETTAGE OF UTERUS    . LEEP    . ROBOTIC PELVIC AND PARA-AORTIC LYMPH NODE DISSECTION Bilateral 02/26/2018   Procedure: XI ROBOTIC BILATERAL PELVIC  LYMPH NODE DISSECTION;  Surgeon:  Isabel Caprice, MD;  Location: [facility];  Service: Gynecology;  Laterality: Bilateral;    Past Medical Hx:  Past Medical History:  Diagnosis Date  . Cervical cancer ([place])     Family Hx:  Family History  Problem Relation Age of Onset  . Hypertension Father     Review of Systems:  Constitutional  Feels well *** Skin No rash, sores, jaundice, itching, dryness,  Cardiovascular  No chest pain, shortness of breath, or edema  Pulmonary  No cough or wheeze.  Gastro Intestinal  No nausea, vomitting, or diarrhoea. No bright red blood per rectum, no abdominal pain, change in bowel movement, or constipation.  Genito Urinary  No frequency, urgency, dysuria,  Musculo Skeletal  No myalgia, arthralgia, joint swelling or pain  Neurologic  No weakness, numbness, change in gait,  Psychology  No depression, anxiety, insomnia.     Vitals:  There were no vitals taken for this visit.  Physical Exam: WD female *** Neck  Supple without any enlargements.  Lymph node survey. No cervical supraclavicular cervical or inguinal adenopathy Cardiovascular  Pulse normal rate, regularity and rhythm. S1 and S2 normal. Lungs  Clear to auscultation bilateraly, without wheezes/crackles/rhonchi. Good air movement.  Skin  No rash/lesions/breakdown  Psychiatry  Alert and oriented to person, place, and time  Back No CVA tenderness Abdomen  Normoactive bowel sounds, abdomen soft, non-tender and obese. Surgical  sites intact without evidence of hernia.  Genito Urinary  Vulva/vagina: Normal external female genitalia.  No lesions.   Bladder/urethra:  No lesions or masses  Vagina: *** Rectal  Good tone, no masses no cul de sac nodularity.  Extremities  No bilateral cyanosis, clubbing or edema.

## 2019-09-15 ENCOUNTER — Inpatient Hospital Stay: Payer: Self-pay | Admitting: Gynecologic Oncology

## 2019-10-13 ENCOUNTER — Telehealth: Payer: Self-pay | Admitting: *Deleted

## 2019-10-13 NOTE — Telephone Encounter (Signed)
Patient's daughter called and reschedule appt.

## 2019-10-16 NOTE — Progress Notes (Signed)
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  Normal spontaneous vaginal delivery May 2018. 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  ECC was negative   LEEP was performed on January 09, 2018   Cervical cancer [facility])  01/2018 Surgery   LEEP  Ivasive squamous cell carcinoma arising in a background of high-grade squamous intraepithelial lesion.  Lesion measured 1 mm in depth where the cervix was 9 mm in thickness with the extent of cancer of 4 mm.  Of note there was a small focus suspicious for lymphovascular invasion.  The endocervical margin was negative for squamous intraepithelial lesion or carcinoma however the ectocervical margin was focally noted to have high-grade squamous intraepithelial lesion    02/26/2018 Initial Diagnosis   Cervical cancer (HCC)   02/26/2018 Surgery   Cervical Cold Knife Cone , BPLND   The cervical cone specimen revealed benign ectocervical squamous mucosa no dysplasia or malignancy and it measured anterior and posteriorly 1.4 cm in greatest dimension by 0.8 x 0.5.  The bilateral pelvic lymph nodes were negative for malignancy.   02/26/2018 Cancer Staging   Cancer Staging Cervical cancer [facility]) Staging form: Cervix Uteri, AJCC 8th Edition - Clinical stage from 02/26/2018: FIGO Stage IA1 (cT1a1, cN0, cM0) - Signed by Lafonda Mosses, MD on 10/16/2019    02/26/2018 Cancer Staging   Staging form: Cervix Uteri, AJCC 8th Edition - Clinical stage from 02/26/2018: FIGO Stage IA1 (cT1a1, cN0, cM0) - Signed by Lafonda Mosses, MD on 10/16/2019   02/06/2019 Pathology Results   Pap ECC Pap wnl ECC Granulation tissue   07/24/2019  Pathology Results   ECC Scant mucoinflammatory debris, benign squamous epithelium, no malignancy      Interval History: Has been doing well since her last visit with [place].  Denies abdominal or pelvic pain.  Denies vaginal discharge.  Went more than a year after surgery without a menses.  Had menses in June, most recently in August.  She had her last Depo-Provera injection in September.  She endorses having a good appetite without nausea or vomiting.  She reports continued occasional constipation for which she takes fiber.  She denies any urinary symptoms.  She is sexually active currently and denies any dyspareunia or other complaints.  Past Medical/Surgical History: Past Medical History:  Diagnosis Date  . Cervical cancer [facility])     Past Surgical History:  Procedure Laterality Date  . CERVICAL CONIZATION W/BX N/A 02/26/2018   Procedure: COLD KNIFE CONIZATION OF CERVIX;  Surgeon: Isabel Caprice, MD;  Location: [facility];  Service: Gynecology;  Laterality: N/A;  . DILATION AND CURETTAGE OF UTERUS    . LEEP    . ROBOTIC PELVIC AND PARA-AORTIC LYMPH NODE DISSECTION Bilateral 02/26/2018   Procedure: XI ROBOTIC BILATERAL PELVIC  LYMPH NODE DISSECTION;  Surgeon: Isabel Caprice, MD;  Location: [facility];  Service: Gynecology;  Laterality: Bilateral;    Family History  Problem Relation Age of Onset  . Hypertension Father     Social History   Socioeconomic History  . Marital status: Married    Spouse name: Not on file  . Number of children: Not on file  . Years of education: Not on file  . Highest education level:  Not on file  Occupational History  . Not on file  Social Needs  . Financial resource strain: Not on file  . Food insecurity    Worry: Not on file    Inability: Not on file  . Transportation needs    Medical: Not on file    Non-medical: Not on file  Tobacco Use  . Smoking status: Never Smoker  . Smokeless tobacco: Never Used  Substance and Sexual Activity  . Alcohol use:  No  . Drug use: No  . Sexual activity: Yes    Birth control/protection: Condom  Lifestyle  . Physical activity    Days per week: Not on file    Minutes per session: Not on file  . Stress: Not on file  Relationships  . Social Herbalist on phone: Not on file    Gets together: Not on file    Attends religious service: Not on file    Active member of club or organization: Not on file    Attends meetings of clubs or organizations: Not on file    Relationship status: Not on file  Other Topics Concern  . Not on file  Social History Narrative  . Not on file    Current Medications: No current outpatient medications on file.  Review of Symptoms: Denies appetite change, fevers, chills, fatigue, unexplained weight changes. Denies hearing loss, neck lumps or masses, mouth sores, ringing in ears, voice changes, vision problems. Denies cough, shortness of breath or wheezing. Denies chest pain, leg swelling or palpitations. Denies abdominal distention, pain, constipation, diarrhea, nausea, emesis, early satiety. Denies pain with intercourse, dysuria, frequency, hematuria or incontinence. Denies hot flashes, pelvic pain, vaginal bleeding or discharge. Denies joint pain, back pain, muscle pain or cramps. Denies pruritus, rash or wound. Denies dizziness, headaches, numbness or seizures. Denies swollen glands or lymph nodes, denies easy bruising or bleeding. Denies anxiety, depression, confusion, or decreased concentration.   Physical Exam: BP 127/76 (Patient Position: Sitting)   Pulse 70   Temp 98.7 F (37.1 C) (Temporal)   Resp 18   Ht 5\' 6"  (1.676 m)   Wt 177 lb (80.3 kg)   SpO2 100%   BMI 28.57 kg/m  General: Alert, oriented, no acute distress. HEENT: Atraumatic, sclera anicteric. Chest: Clear to auscultation bilaterally.  No wheezes rhonchi or rales. Cardiovascular: Regular rate and rhythm, no murmurs. Abdomen: soft, nontender.  Normoactive bowel sounds.  No masses  or hepatosplenomegaly appreciated.  Nondistended. Extremities: Grossly normal range of motion.  Warm, well perfused.  No edema bilaterally. Skin: No rashes or lesions noted. Lymphatics: No cervical, supraclavicular, or inguinal adenopathy. GU: Normal appearing external genitalia without erythema, excoriation, or lesions.  Speculum exam reveals well rugated vaginal mucosa, cervix normal appearing without lesions or masses, evidence of prior CKC.  Bimanual exam reveals mobile uterus, no cervical nodularity or firmness.  No adnexal masses.   Laboratory & Radiologic Studies: 8/20: ECC reveals benign squamous epithelium, no dysplasia or malignancy  Assessment & Plan: Kim Sanders is a 28 y.o. 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  Patient is overall doing well and is NED.  We will plan to continue every 77-month visits until March 2021, at which point she will be 2 years out from diagnosis and initial treatment.  Then we will move to every 54-month visits until March 2024.  We will plan for annual Pap and ECC every March.  The patient was interested in the HPV vaccine.  We discussed its typical administration prior to any exposure to the virus as well as new approval up to the age of 30.  Despite this approval, many insurances are not covering the vaccine in patients over the age of 74.  As the patient does not have insurance, it appears that it would be least expensive for her to get the vaccine series at the health department clinic.  I called and was told that the cost for the 3 doses of the vaccine is approximately $260.  There would still be some benefit for her to get the vaccine given the risk of infection with additional HPV strains.  Jeral Pinch, MD  Division of Gynecologic Oncology  Department of Obstetrics and Gynecology  [facility]

## 2019-10-20 ENCOUNTER — Encounter: Payer: Self-pay | Admitting: Gynecologic Oncology

## 2019-10-20 ENCOUNTER — Inpatient Hospital Stay: Payer: Self-pay | Attending: Obstetrics | Admitting: Gynecologic Oncology

## 2019-10-20 ENCOUNTER — Other Ambulatory Visit: Payer: Self-pay

## 2019-10-20 VITALS — BP 127/76 | HR 70 | Temp 98.7°F | Resp 18 | Ht 66.0 in | Wt 177.0 lb

## 2019-10-20 DIAGNOSIS — K5900 Constipation, unspecified: Secondary | ICD-10-CM | POA: Insufficient documentation

## 2019-10-20 DIAGNOSIS — C539 Malignant neoplasm of cervix uteri, unspecified: Secondary | ICD-10-CM

## 2019-10-20 NOTE — Patient Instructions (Signed)
It was a pleasure meeting you today. We will see you in 3 months for exam and pap test.  Department of [facility]: 1st Floor [phone] for 3 vaccines  Please call us with any issues prior to your appointment [phone]

## 2020-01-13 NOTE — Progress Notes (Deleted)
Gynecologic Oncology Return Clinic Visit  @DATE @  Reason for Visit: ***  Treatment History: Oncology History Overview Note  Kim Sanders  had an intrapartum diagnosis ASCUS cannot rule out high-grade lesion on Pap testing in March 2018.  Normal spontaneous vaginal delivery May 2018. 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  ECC was negative   LEEP was performed on January 09, 2018   Cervical cancer [facility])  01/2018 Surgery   LEEP  Ivasive squamous cell carcinoma arising in a background of high-grade squamous intraepithelial lesion.  Lesion measured 1 mm in depth where the cervix was 9 mm in thickness with the extent of cancer of 4 mm.  Of note there was a small focus suspicious for lymphovascular invasion.  The endocervical margin was negative for squamous intraepithelial lesion or carcinoma however the ectocervical margin was focally noted to have high-grade squamous intraepithelial lesion    02/26/2018 Initial Diagnosis   Cervical cancer (HCC)   02/26/2018 Surgery   Cervical Cold Knife Cone , BPLND   The cervical cone specimen revealed benign ectocervical squamous mucosa no dysplasia or malignancy and it measured anterior and posteriorly 1.4 cm in greatest dimension by 0.8 x 0.5.  The bilateral pelvic lymph nodes were negative for malignancy.   02/26/2018 Cancer Staging   Cancer Staging Cervical cancer [facility]) Staging form: Cervix Uteri, AJCC 8th Edition - Clinical stage from 02/26/2018: FIGO Stage IA1 (cT1a1, cN0, cM0) - Signed by Lafonda Mosses, MD on 10/16/2019    02/26/2018 Cancer Staging   Staging form: Cervix Uteri, AJCC 8th Edition - Clinical stage from 02/26/2018: FIGO Stage IA1 (cT1a1, cN0, cM0) - Signed by Lafonda Mosses, MD on 10/16/2019   02/06/2019 Pathology Results   Pap ECC Pap wnl ECC Granulation tissue   07/24/2019 Pathology Results   ECC Scant mucoinflammatory debris, benign squamous epithelium, no  malignancy      Interval History: ***  Past Medical/Surgical History: Past Medical History:  Diagnosis Date  . Cervical cancer [facility])     Past Surgical History:  Procedure Laterality Date  . CERVICAL CONIZATION W/BX N/A 02/26/2018   Procedure: COLD KNIFE CONIZATION OF CERVIX;  Surgeon: Isabel Caprice, MD;  [facility];  Service: Gynecology;  Laterality: N/A;  . DILATION AND CURETTAGE OF UTERUS    . LEEP    . ROBOTIC PELVIC AND PARA-AORTIC LYMPH NODE DISSECTION Bilateral 02/26/2018   Procedure: XI ROBOTIC BILATERAL PELVIC  LYMPH NODE DISSECTION;  Surgeon: Isabel Caprice, MD;  [facility];  Service: Gynecology;  Laterality: Bilateral;    Family History  Problem Relation Age of Onset  . Hypertension Father     Social History   Socioeconomic History  . Marital status: Married    Spouse name: Not on file  . Number of children: Not on file  . Years of education: Not on file  . Highest education level: Not on file  Occupational History  . Not on file  Tobacco Use  . Smoking status: Never Smoker  . Smokeless tobacco: Never Used  Substance and Sexual Activity  . Alcohol use: No  . Drug use: No  . Sexual activity: Yes    Birth control/protection: Condom  Other Topics Concern  . Not on file  Social History Narrative  . Not on file   Social Determinants of Health   Financial Resource Strain:   . Difficulty of Paying Living Expenses: Not on file  Food Insecurity:   . Worried About Running  Out of Food in the Last Year: Not on file  . Ran Out of Food in the Last Year: Not on file  Transportation Needs:   . Lack of Transportation (Medical): Not on file  . Lack of Transportation (Non-Medical): Not on file  Physical Activity:   . Days of Exercise per Week: Not on file  . Minutes of Exercise per Session: Not on file  Stress:   . Feeling of Stress : Not on file  Social Connections:   . Frequency of Communication with Friends and Family: Not on file  .  Frequency of Social Gatherings with Friends and Family: Not on file  . Attends Religious Services: Not on file  . Active Member of Clubs or Organizations: Not on file  . Attends Archivist Meetings: Not on file  . Marital Status: Not on file    Current Medications: No current outpatient medications on file.  Review of Systems: Denies appetite changes, fevers, chills, fatigue, unexplained weight changes. Denies hearing loss, neck lumps or masses, mouth sores, ringing in ears or voice changes. Denies cough or wheezing.  Denies shortness of breath. Denies chest pain or palpitations. Denies leg swelling. Denies abdominal distention, pain, blood in stools, constipation, diarrhea, nausea, vomiting, or early satiety. Denies pain with intercourse, dysuria, frequency, hematuria or incontinence. Denies hot flashes, pelvic pain, vaginal bleeding or vaginal discharge.   Denies joint pain, back pain or muscle pain/cramps. Denies itching, rash, or wounds. Denies dizziness, headaches, numbness or seizures. Denies swollen lymph nodes or glands, denies easy bruising or bleeding. Denies anxiety, depression, confusion, or decreased concentration.  Physical Exam: There were no vitals taken for this visit. General: ***Alert, oriented, no acute distress. HEENT: ***Posterior oropharynx clear, sclera anicteric. Chest: ***Clear to auscultation bilaterally.  ***Port site clean. Cardiovascular: ***Regular rate and rhythm, no murmurs. Abdomen: ***Obese, soft, nontender.  Normoactive bowel sounds.  No masses or hepatosplenomegaly appreciated.  ***Well-healed scar. Extremities: ***Grossly normal range of motion.  Warm, well perfused.  No edema bilaterally. Skin: ***No rashes or lesions noted. Lymphatics: ***No cervical, supraclavicular, or inguinal adenopathy. GU: Normal appearing external genitalia without erythema, excoriation, or lesions.  Speculum exam reveals ***.  Bimanual exam reveals ***.   ***Rectovaginal exam  confirms ___.  Laboratory & Radiologic Studies: ***  Assessment & Plan: Kim Sanders is a 29 y.o. woman with Stage *** who presents for ***.  ***  Per NCCN guidelines, we discussed that after childbearing is finished, hysterectomy can be considered after treatment of early stage cervical cancer  - if they have chronic, persistent HPV infection, persistent abnormal pap tests, or they desire surgery.  *** minutes of total time was spent for this patient encounter, including preparation, face-to-face counseling with the patient and coordination of care, and documentation of the encounter.  Jeral Pinch, MD  Division of Gynecologic Oncology  Department of Obstetrics and Gynecology  [facility]

## 2020-01-15 ENCOUNTER — Telehealth: Payer: Self-pay | Admitting: *Deleted

## 2020-01-15 NOTE — Telephone Encounter (Signed)
Called and the patient a message to call the office back. Need to move the patient's appt

## 2020-01-16 ENCOUNTER — Telehealth: Payer: Self-pay | Admitting: *Deleted

## 2020-01-16 NOTE — Telephone Encounter (Signed)
Called and left the patient a message to call the office back to reschedule her appt.

## 2020-01-19 NOTE — Progress Notes (Deleted)
Gynecologic Oncology Return Clinic Visit  2/18  Reason for Visit: surveillance in the setting of early-stage cervical cancer  Treatment History: Oncology History Overview Note  Kim Sanders  had an intrapartum diagnosis ASCUS cannot rule out high-grade lesion on Pap testing in March 2018.  Normal spontaneous vaginal delivery May 2018. 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  ECC was negative   LEEP was performed on January 09, 2018   Cervical cancer [facility])  01/2018 Surgery   LEEP  Ivasive squamous cell carcinoma arising in a background of high-grade squamous intraepithelial lesion.  Lesion measured 1 mm in depth where the cervix was 9 mm in thickness with the extent of cancer of 4 mm.  Of note there was a small focus suspicious for lymphovascular invasion.  The endocervical margin was negative for squamous intraepithelial lesion or carcinoma however the ectocervical margin was focally noted to have high-grade squamous intraepithelial lesion    02/26/2018 Initial Diagnosis   Cervical cancer (HCC)   02/26/2018 Surgery   Cervical Cold Knife Cone , BPLND   The cervical cone specimen revealed benign ectocervical squamous mucosa no dysplasia or malignancy and it measured anterior and posteriorly 1.4 cm in greatest dimension by 0.8 x 0.5.  The bilateral pelvic lymph nodes were negative for malignancy.   02/26/2018 Cancer Staging   Cancer Staging Cervical cancer [facility]) Staging form: Cervix Uteri, AJCC 8th Edition - Clinical stage from 02/26/2018: FIGO Stage IA1 (cT1a1, cN0, cM0) - Signed by Lafonda Mosses, MD on 10/16/2019    02/26/2018 Cancer Staging   Staging form: Cervix Uteri, AJCC 8th Edition - Clinical stage from 02/26/2018: FIGO Stage IA1 (cT1a1, cN0, cM0) - Signed by Lafonda Mosses, MD on 10/16/2019   02/06/2019 Pathology Results   Pap ECC Pap wnl ECC Granulation tissue   07/24/2019 Pathology Results   ECC Scant  mucoinflammatory debris, benign squamous epithelium, no malignancy      Interval History: ***  Past Medical/Surgical History: Past Medical History:  Diagnosis Date  . Cervical cancer [facility])     Past Surgical History:  Procedure Laterality Date  . CERVICAL CONIZATION W/BX N/A 02/26/2018   Procedure: COLD KNIFE CONIZATION OF CERVIX;  Surgeon: Isabel Caprice, MD;  Location: [facility];  Service: Gynecology;  Laterality: N/A;  . DILATION AND CURETTAGE OF UTERUS    . LEEP    . ROBOTIC PELVIC AND PARA-AORTIC LYMPH NODE DISSECTION Bilateral 02/26/2018   Procedure: XI ROBOTIC BILATERAL PELVIC  LYMPH NODE DISSECTION;  Surgeon: Isabel Caprice, MD;  [facility];  Service: Gynecology;  Laterality: Bilateral;    Family History  Problem Relation Age of Onset  . Hypertension Father     Social History   Socioeconomic History  . Marital status: Married    Spouse name: Not on file  . Number of children: Not on file  . Years of education: Not on file  . Highest education level: Not on file  Occupational History  . Not on file  Tobacco Use  . Smoking status: Never Smoker  . Smokeless tobacco: Never Used  Substance and Sexual Activity  . Alcohol use: No  . Drug use: No  . Sexual activity: Yes    Birth control/protection: Condom  Other Topics Concern  . Not on file  Social History Narrative  . Not on file   Social Determinants of Health   Financial Resource Strain:   . Difficulty of Paying Living Expenses: Not on file  Food  Insecurity:   . Worried About Charity fundraiser in the Last Year: Not on file  . Ran Out of Food in the Last Year: Not on file  Transportation Needs:   . Lack of Transportation (Medical): Not on file  . Lack of Transportation (Non-Medical): Not on file  Physical Activity:   . Days of Exercise per Week: Not on file  . Minutes of Exercise per Session: Not on file  Stress:   . Feeling of Stress : Not on file  Social Connections:   . Frequency of  Communication with Friends and Family: Not on file  . Frequency of Social Gatherings with Friends and Family: Not on file  . Attends Religious Services: Not on file  . Active Member of Clubs or Organizations: Not on file  . Attends Archivist Meetings: Not on file  . Marital Status: Not on file    Current Medications: No current outpatient medications on file.  Review of Systems: Denies appetite changes, fevers, chills, fatigue, unexplained weight changes. Denies hearing loss, neck lumps or masses, mouth sores, ringing in ears or voice changes. Denies cough or wheezing.  Denies shortness of breath. Denies chest pain or palpitations. Denies leg swelling. Denies abdominal distention, pain, blood in stools, constipation, diarrhea, nausea, vomiting, or early satiety. Denies pain with intercourse, dysuria, frequency, hematuria or incontinence. Denies hot flashes, pelvic pain, vaginal bleeding or vaginal discharge.   Denies joint pain, back pain or muscle pain/cramps. Denies itching, rash, or wounds. Denies dizziness, headaches, numbness or seizures. Denies swollen lymph nodes or glands, denies easy bruising or bleeding. Denies anxiety, depression, confusion, or decreased concentration.  Physical Exam: There were no vitals taken for this visit. General: ***Alert, oriented, no acute distress. HEENT: ***Posterior oropharynx clear, sclera anicteric. Chest: ***Clear to auscultation bilaterally.  ***Port site clean. Cardiovascular: ***Regular rate and rhythm, no murmurs. Abdomen: ***Obese, soft, nontender.  Normoactive bowel sounds.  No masses or hepatosplenomegaly appreciated.  ***Well-healed scar. Extremities: ***Grossly normal range of motion.  Warm, well perfused.  No edema bilaterally. Skin: ***No rashes or lesions noted. Lymphatics: ***No cervical, supraclavicular, or inguinal adenopathy. GU: Normal appearing external genitalia without erythema, excoriation, or lesions.   Speculum exam reveals ***.  Bimanual exam reveals ***.  ***Rectovaginal exam  confirms ___.  Laboratory & Radiologic Studies: ***  Assessment & Plan: Kim Sanders is a 29 y.o. 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  Patient is overall doing well and is NED. Since she is 2 years out from diagnosis and initial treatment, we will move to every 13-month surveillance visits until March 2024.  We will plan for annual Pap and ECC every March.  ***The patient was interested in the HPV vaccine.  We discussed its typical administration prior to any exposure to the virus as well as new approval up to the age of 50.  Despite this approval, many insurances are not covering the vaccine in patients over the age of 52.  As the patient does not have insurance, it appears that it would be least expensive for her to get the vaccine series at the health department clinic.  I called and was told that the cost for the 3 doses of the vaccine is approximately $260.  There would still be some benefit for her to get the vaccine given the risk of infection with additional HPV strains.  *** minutes of total time was spent for this patient  encounter, including preparation, face-to-face counseling with the patient and coordination of care, and documentation of the encounter.  Jeral Pinch, MD  Division of Gynecologic Oncology  Department of Obstetrics and Gynecology  [facility]

## 2020-01-20 ENCOUNTER — Ambulatory Visit: Payer: Self-pay | Admitting: Gynecologic Oncology

## 2020-01-22 ENCOUNTER — Ambulatory Visit: Payer: Self-pay | Admitting: Gynecologic Oncology

## 2020-01-29 NOTE — Progress Notes (Signed)
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  Normal spontaneous vaginal delivery May 2018. 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  ECC was negative   LEEP was performed on January 09, 2018   Cervical cancer [facility])  01/2018 Surgery   LEEP  Ivasive squamous cell carcinoma arising in a background of high-grade squamous intraepithelial lesion.  Lesion measured 1 mm in depth where the cervix was 9 mm in thickness with the extent of cancer of 4 mm.  Of note there was a small focus suspicious for lymphovascular invasion.  The endocervical margin was negative for squamous intraepithelial lesion or carcinoma however the ectocervical margin was focally noted to have high-grade squamous intraepithelial lesion    02/26/2018 Initial Diagnosis   Cervical cancer (HCC)   02/26/2018 Surgery   Cervical Cold Knife Cone , BPLND   The cervical cone specimen revealed benign ectocervical squamous mucosa no dysplasia or malignancy and it measured anterior and posteriorly 1.4 cm in greatest dimension by 0.8 x 0.5.  The bilateral pelvic lymph nodes were negative for malignancy.   02/26/2018 Cancer Staging   Cancer Staging Cervical cancer [facility]) Staging form: Cervix Uteri, AJCC 8th Edition - Clinical stage from 02/26/2018: FIGO Stage IA1 (cT1a1, cN0, cM0) - Signed by Lafonda Mosses, MD on 10/16/2019    02/26/2018 Cancer Staging   Staging form: Cervix Uteri, AJCC 8th Edition - Clinical stage from 02/26/2018: FIGO Stage IA1 (cT1a1, cN0, cM0) - Signed by Lafonda Mosses, MD on 10/16/2019   02/06/2019 Pathology Results   Pap ECC Pap wnl ECC Granulation tissue   07/24/2019 Pathology  Results   ECC Scant mucoinflammatory debris, benign squamous epithelium, no malignancy      Interval History: Doing well since her last visit.  She denies any vaginal discharge or bleeding.  Her last Depo-Provera injection was at the end of January.  She denies any pelvic or abdominal pain.  She denies any changes to her appetite.  She continues to have occasional constipation and denies any urinary symptoms.  She is not currently planning pregnancy but is interested in the option of future fertility.  Past Medical/Surgical History: Past Medical History:  Diagnosis Date  . Cervical cancer [facility])     Past Surgical History:  Procedure Laterality Date  . CERVICAL CONIZATION W/BX N/A 02/26/2018   Procedure: COLD KNIFE CONIZATION OF CERVIX;  Surgeon: Isabel Caprice, MD;  Location: [facility];  Service: Gynecology;  Laterality: N/A;  . DILATION AND CURETTAGE OF UTERUS    . LEEP    . ROBOTIC PELVIC AND PARA-AORTIC LYMPH NODE DISSECTION Bilateral 02/26/2018   Procedure: XI ROBOTIC BILATERAL PELVIC  LYMPH NODE DISSECTION;  Surgeon: Isabel Caprice, MD;  Location: [facility];  Service: Gynecology;  Laterality: Bilateral;    Family History  Problem Relation Age of Onset  . Hypertension Father     Social History   Socioeconomic History  . Marital status: Married    Spouse name: Not on file  . Number of children: Not on file  . Years of education: Not on file  . Highest education level: Not on file  Occupational History  . Not on file  Tobacco Use  . Smoking status: Never Smoker  .  Smokeless tobacco: Never Used  Substance and Sexual Activity  . Alcohol use: No  . Drug use: No  . Sexual activity: Yes    Birth control/protection: Condom  Other Topics Concern  . Not on file  Social History Narrative  . Not on file   Social Determinants of Health   Financial Resource Strain:   . Difficulty of Paying Living Expenses: Not on file  Food Insecurity:   . Worried About Sales executive in the Last Year: Not on file  . Ran Out of Food in the Last Year: Not on file  Transportation Needs:   . Lack of Transportation (Medical): Not on file  . Lack of Transportation (Non-Medical): Not on file  Physical Activity:   . Days of Exercise per Week: Not on file  . Minutes of Exercise per Session: Not on file  Stress:   . Feeling of Stress : Not on file  Social Connections:   . Frequency of Communication with Friends and Family: Not on file  . Frequency of Social Gatherings with Friends and Family: Not on file  . Attends Religious Services: Not on file  . Active Member of Clubs or Organizations: Not on file  . Attends Archivist Meetings: Not on file  . Marital Status: Not on file    Current Medications: No current outpatient medications on file.  Review of Systems: Denies appetite changes, fevers, chills, fatigue, unexplained weight changes. Denies hearing loss, neck lumps or masses, mouth sores, ringing in ears or voice changes. Denies cough or wheezing.  Denies shortness of breath. Denies chest pain or palpitations. Denies leg swelling. Denies abdominal distention, pain, blood in stools, constipation, diarrhea, nausea, vomiting, or early satiety. Denies pain with intercourse, dysuria, frequency, hematuria or incontinence. Denies hot flashes, pelvic pain, vaginal bleeding or vaginal discharge.   Denies joint pain, back pain or muscle pain/cramps. Denies itching, rash, or wounds. Denies dizziness, headaches, numbness or seizures. Denies swollen lymph nodes or glands, denies easy bruising or bleeding. Denies anxiety, depression, confusion, or decreased concentration.  Physical Exam: BP 130/80 (BP Location: Left Arm, Patient Position: Sitting)   Pulse 70   Temp 98.5 F (36.9 C) (Temporal)   Resp 18   Ht 5\' 4"  (1.626 m)   Wt 183 lb (83 kg)   SpO2 100%   BMI 31.41 kg/m  General: Alert, oriented, no acute distress. HEENT: Normocephalic, atraumatic,  sclera anicteric. Chest: Clear to auscultation bilaterally.   Cardiovascular: Regular rate and rhythm, no murmurs. Skin: No rashes or lesions noted. Lymphatics: No cervical, supraclavicular, or inguinal adenopathy. GU: Normal appearing external genitalia without erythema, excoriation, or lesions.  Speculum exam reveals well rugated vaginal mucosa.  Cervix without lesions and evidence of prior cold knife cone, no bleeding or discharge in the vaginal vault.  Bimanual exam reveals no nodularity or masses of the cervix, uterus small and mobile, no adnexal masses appreciated.  Pap test and ECC collected today.  Laboratory & Radiologic Studies: None new  Assessment & Plan: Kim Sanders is a 29 y.o. 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  Patient is overall doing well and is NED.    She is now 2 years out from surgery and has been followed every 3 months.  We will plan to move to every 1-month visits and annual Pap and ECC, which was performed today.  I will call her with these results.  The patient and I talked about her interest in the HPV vaccine at her last visit.  She has not gone to the department of health clinic to inquire about this.  15 minutes of total time was spent for this patient encounter, including preparation, face-to-face counseling with the patient and coordination of care, and documentation of the encounter.  Jeral Pinch, MD  Division of Gynecologic Oncology  Department of Obstetrics and Gynecology  [facility]

## 2020-01-30 ENCOUNTER — Encounter: Payer: Self-pay | Admitting: Gynecologic Oncology

## 2020-01-30 ENCOUNTER — Other Ambulatory Visit: Payer: Self-pay

## 2020-01-30 ENCOUNTER — Inpatient Hospital Stay: Payer: Self-pay | Attending: Gynecologic Oncology | Admitting: Gynecologic Oncology

## 2020-01-30 ENCOUNTER — Other Ambulatory Visit (HOSPITAL_COMMUNITY)
Admission: RE | Admit: 2020-01-30 | Discharge: 2020-01-30 | Disposition: A | Discharge status: Home / Self Care | Payer: Self-pay | Source: Ambulatory Visit | Attending: Gynecologic Oncology | Admitting: Gynecologic Oncology

## 2020-01-30 VITALS — BP 130/80 | HR 70 | Temp 98.5°F | Resp 18 | Ht 64.0 in | Wt 183.0 lb

## 2020-01-30 DIAGNOSIS — Z8541 Personal history of malignant neoplasm of cervix uteri: Secondary | ICD-10-CM | POA: Insufficient documentation

## 2020-01-30 DIAGNOSIS — C530 Malignant neoplasm of endocervix: Secondary | ICD-10-CM | POA: Insufficient documentation

## 2020-01-30 DIAGNOSIS — Z124 Encounter for screening for malignant neoplasm of cervix: Secondary | ICD-10-CM | POA: Insufficient documentation

## 2020-01-30 DIAGNOSIS — K5900 Constipation, unspecified: Secondary | ICD-10-CM | POA: Insufficient documentation

## 2020-01-30 DIAGNOSIS — Z08 Encounter for follow-up examination after completed treatment for malignant neoplasm: Secondary | ICD-10-CM | POA: Insufficient documentation

## 2020-01-30 NOTE — Patient Instructions (Signed)
You are doing great today without any evidence of cancer on your exam.  I would like to see you again in 6 months.  Please call in June to get that appointment scheduled.  If you develop vaginal discharge, bleeding, pelvic pain or other symptoms before that, please call to see me sooner at [phone].

## 2020-01-30 NOTE — Addendum Note (Signed)
Addended by: Joylene John D on: 01/30/2020 03:38 PM   Modules accepted: Orders

## 2020-02-03 ENCOUNTER — Telehealth: Payer: Self-pay

## 2020-02-03 LAB — SURGICAL PATHOLOGY

## 2020-02-03 NOTE — Telephone Encounter (Signed)
Faxed note with requested HPV 16 & 18to be specifically done on Pap Smear from 01-30-20

## 2020-02-09 LAB — CYTOLOGY - PAP
Adequacy: ABSENT
Comment: NEGATIVE
Comment: NEGATIVE
Diagnosis: NEGATIVE
HPV 16: NEGATIVE
HPV 18 / 45: NEGATIVE
High risk HPV: POSITIVE — AB

## 2020-02-10 NOTE — Telephone Encounter (Signed)
Received results of HPV 16 and 18. Results given to [place] to review.

## 2020-02-16 ENCOUNTER — Telehealth: Payer: Self-pay

## 2020-02-16 NOTE — Telephone Encounter (Signed)
Need to inform the patient about the Pap smear results as noted below by Dr. Berline Lopes when is is reached by phone.

## 2020-02-16 NOTE — Telephone Encounter (Signed)
Your biopsy was negative for cancer!Your pap was negative. 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 We will plan to repeat the pap and HPV testing in one year.  Written by Lafonda Mosses, MD on 02/10/2020 7:53 AM EST

## 2020-02-17 NOTE — Telephone Encounter (Signed)
Mailed a copy of Pap Smear report to patient as she requested.

## 2020-02-17 NOTE — Telephone Encounter (Signed)
Told her the results of the Pap Smear as noted below by Dr. Berline Lopes. Pt verbalized understanding.

## 2020-05-22 IMAGING — US US PELVIS COMPLETE TRANSABD/TRANSVAG
1 series · 13 of 25 positions shown · non-contrast
Comparison: None available.

CLINICAL DATA: 27-year-old female with history of cervical cancer,
now with abnormal periods status post conization procedure.



[Series 1: us pelvis complete transabd/transvag · 13 of 98 slices shown]
[im 1/98]
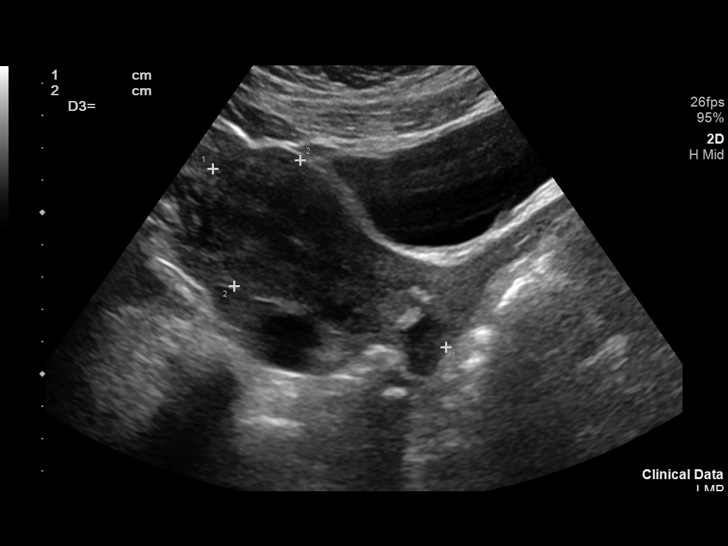
[im 9/98]
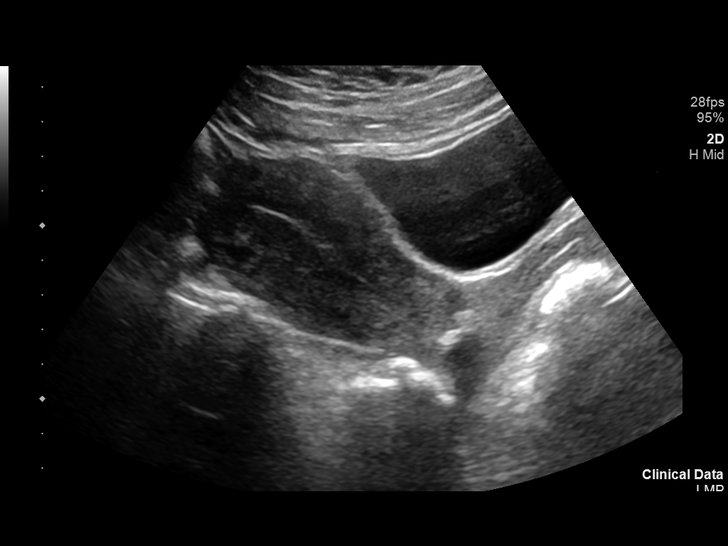
[im 17/98]
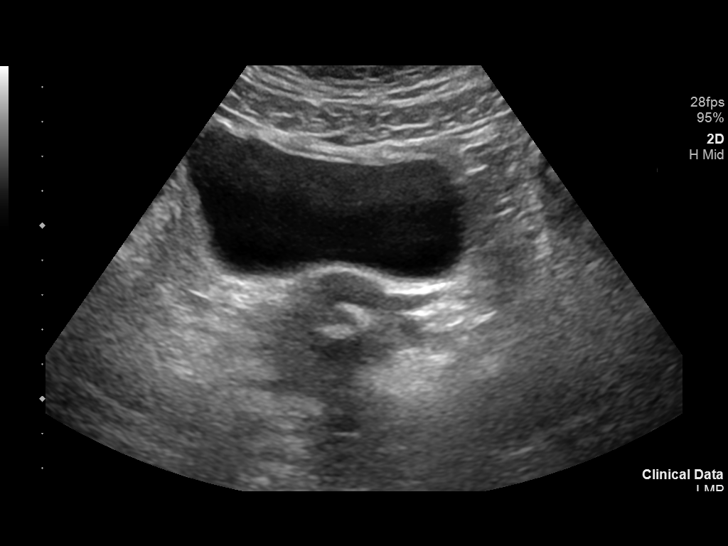
[im 25/98]
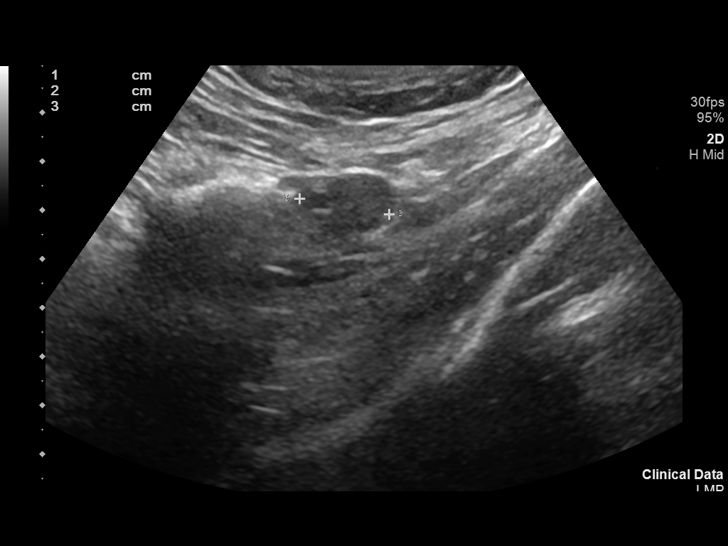
[im 33/98]
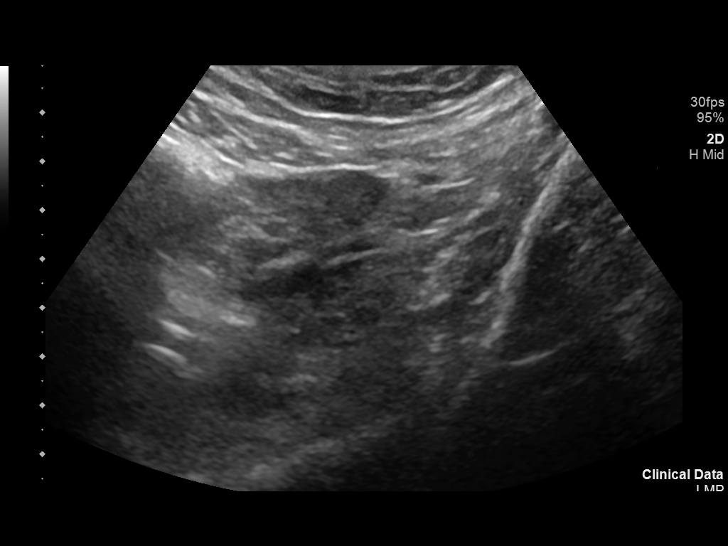
[im 41/98]
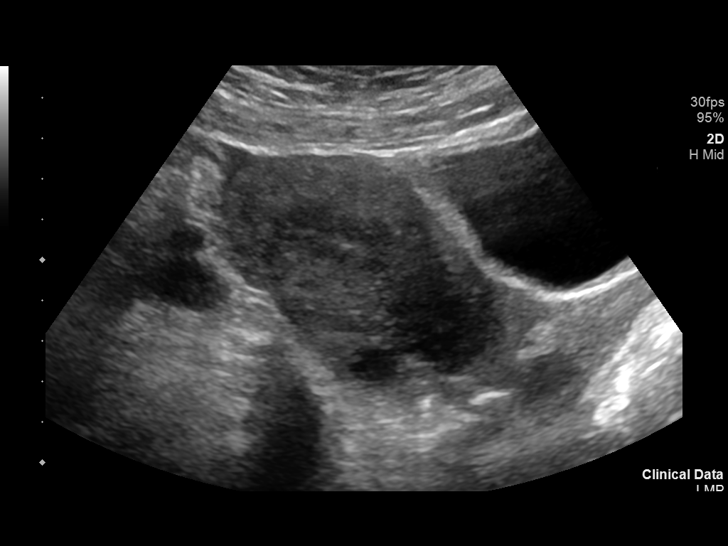
[im 49/98]
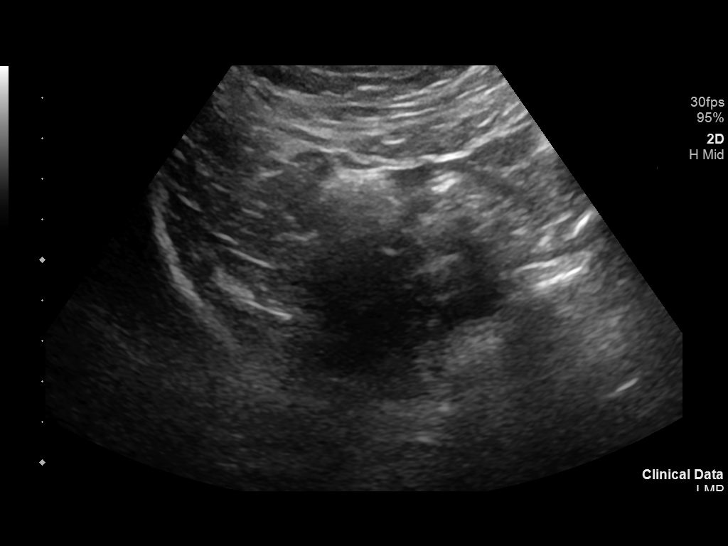
[im 57/98]
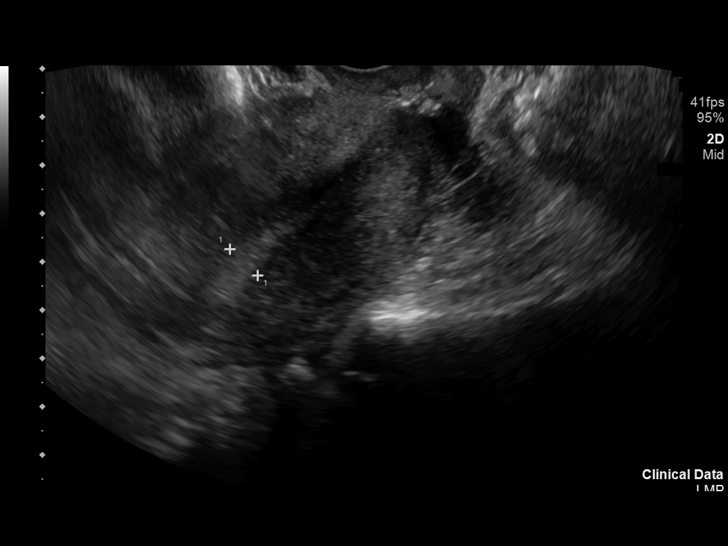
[im 65/98]
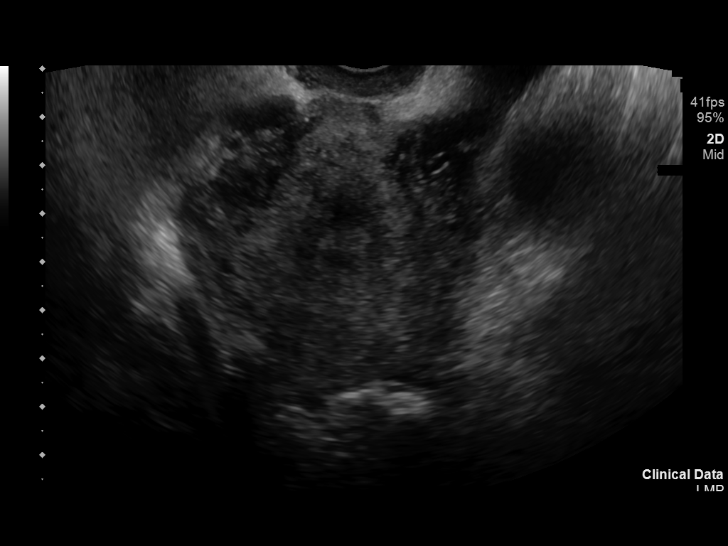
[im 73/98]
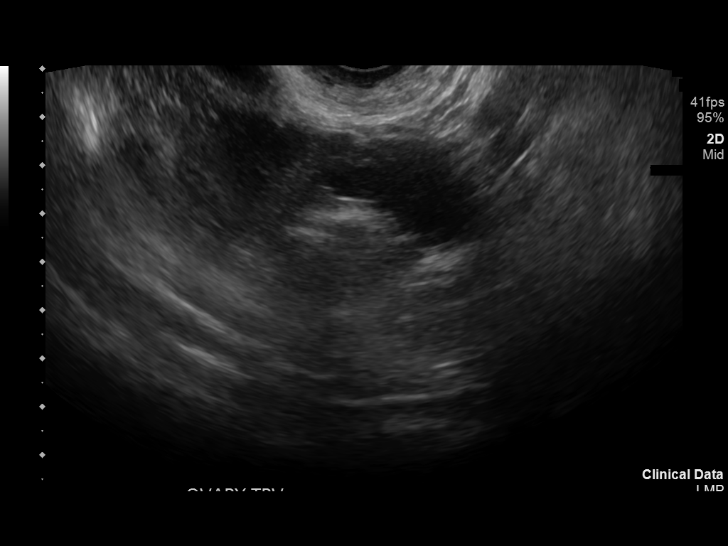
[im 81/98]
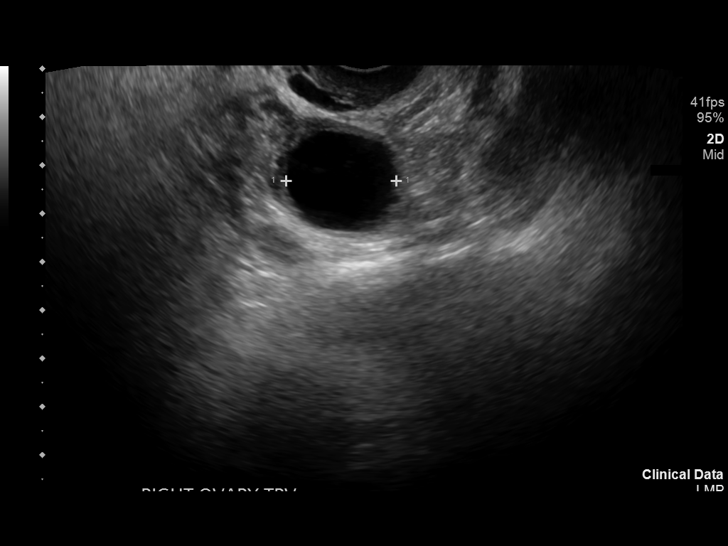
[im 89/98]
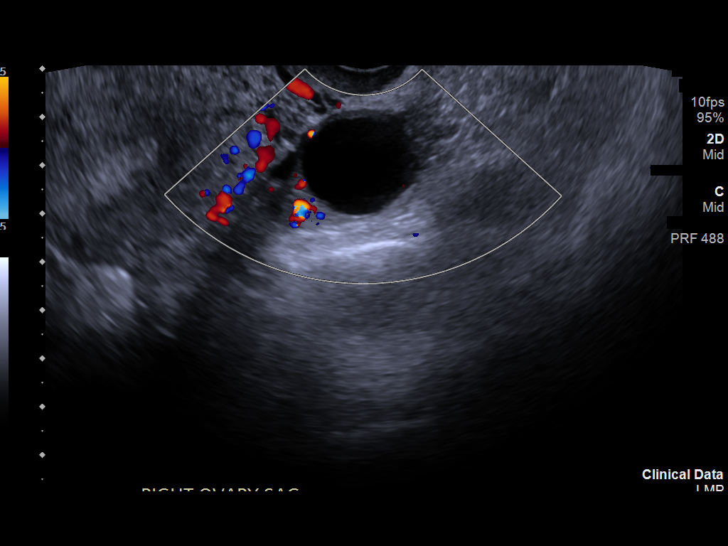
[im 98/98]
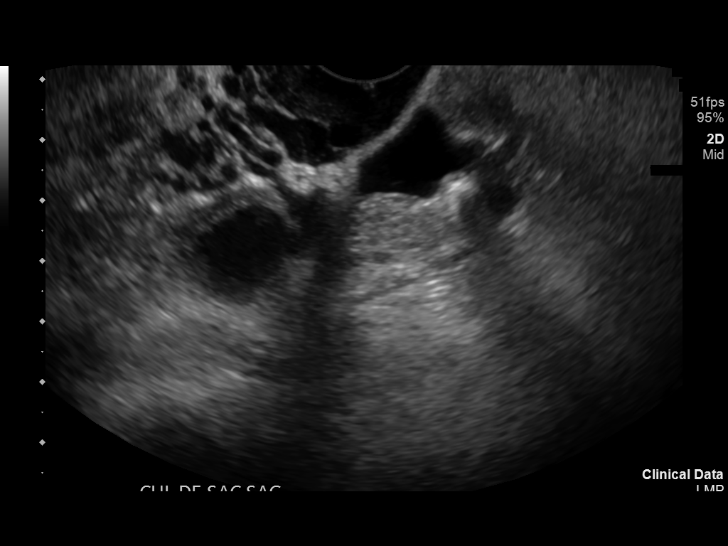

[13 of 25 positions shown; findings below may reference images not displayed]

FINDINGS: Uterus

Measurements: 9.1 x 4.4 x 5.1 cm = volume: 106.6 mL. No fibroids or
other mass visualized. Hypoechoic defect at the level of the cervix
most likely related to history of LEEP procedure. No discrete mass
within this region evident by sonography.

Endometrium

Thickness: 7.9 mm. No focal abnormality visualized. No hematometra.

Right ovary

Measurements: 3.8 x 2.4 x 3.0 cm = volume: 14.2 mL. Normal
appearance/no adnexal mass. 2.3 x 2.0 x 2.3 cm simple physiologic
follicular cyst/dominant follicle noted.

Left ovary

Measurements: 2.7 x 1.3 x 1.9 cm = volume: 3.5 mL. Normal
appearance/no adnexal mass.

Other findings

Trace free physiologic fluid within the pelvis.
IMPRESSION: 1. Normal sonographic appearance of the endometrium. No hematometra
or other focal abnormality.
2. Sequelae of prior LEEP procedure at the cervix.
3. Otherwise normal appearance of the uterus and ovaries. No other
pelvic or adnexal mass.

## 2020-07-12 ENCOUNTER — Telehealth: Payer: Self-pay | Admitting: *Deleted

## 2020-07-12 NOTE — Telephone Encounter (Signed)
Called the patient and scheduled a follow up appt for next week to see Dr Berline Lopes

## 2020-07-19 ENCOUNTER — Ambulatory Visit: Payer: Self-pay | Admitting: Gynecologic Oncology

## 2020-07-19 NOTE — Progress Notes (Deleted)
Gynecologic Oncology Return Clinic Visit  07/19/20  Reason for Visit: Surveillance in the setting of early-stage cervix cancers/pfertility sparing treatment  Treatment History: Oncology History Overview Note  Kim Sanders  had an intrapartum diagnosis ASCUS cannot rule out high-grade lesion on Pap testing in March 2018.  Normal spontaneous vaginal delivery May 2018. 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  ECC was negative   LEEP was performed on January 09, 2018   Cervical cancer [facility])  01/2018 Surgery   LEEP  Ivasive squamous cell carcinoma arising in a background of high-grade squamous intraepithelial lesion.  Lesion measured 1 mm in depth where the cervix was 9 mm in thickness with the extent of cancer of 4 mm.  Of note there was a small focus suspicious for lymphovascular invasion.  The endocervical margin was negative for squamous intraepithelial lesion or carcinoma however the ectocervical margin was focally noted to have high-grade squamous intraepithelial lesion    02/26/2018 Initial Diagnosis   Cervical cancer (HCC)   02/26/2018 Surgery   Cervical Cold Knife Cone , BPLND   The cervical cone specimen revealed benign ectocervical squamous mucosa no dysplasia or malignancy and it measured anterior and posteriorly 1.4 cm in greatest dimension by 0.8 x 0.5.  The bilateral pelvic lymph nodes were negative for malignancy.   02/26/2018 Cancer Staging   Cancer Staging Cervical cancer [facility]) Staging form: Cervix Uteri, AJCC 8th Edition - Clinical stage from 02/26/2018: FIGO Stage IA1 (cT1a1, cN0, cM0) - Signed by Lafonda Mosses, MD on 10/16/2019    02/26/2018 Cancer Staging   Staging form: Cervix Uteri, AJCC 8th Edition - Clinical stage from 02/26/2018: FIGO Stage IA1 (cT1a1, cN0, cM0) - Signed by Lafonda Mosses, MD on 10/16/2019   02/06/2019 Pathology Results   Pap ECC Pap wnl ECC Granulation tissue   07/24/2019 Pathology  Results   ECC Scant mucoinflammatory debris, benign squamous epithelium, no malignancy      Interval History: ***  Doing well since her last visit.  She denies any vaginal discharge or bleeding.  Her last Depo-Provera injection was at the end of January.  She denies any pelvic or abdominal pain.  She denies any changes to her appetite.  She continues to have occasional constipation and denies any urinary symptoms.  She is not currently planning pregnancy but is interested in the option of future fertility.  Past Medical/Surgical History: Past Medical History:  Diagnosis Date  . Cervical cancer [facility])     Past Surgical History:  Procedure Laterality Date  . CERVICAL CONIZATION W/BX N/A 02/26/2018   Procedure: COLD KNIFE CONIZATION OF CERVIX;  Surgeon: Isabel Caprice, MD;  Location: [facility];  Service: Gynecology;  Laterality: N/A;  . DILATION AND CURETTAGE OF UTERUS    . LEEP    . ROBOTIC PELVIC AND PARA-AORTIC LYMPH NODE DISSECTION Bilateral 02/26/2018   Procedure: XI ROBOTIC BILATERAL PELVIC  LYMPH NODE DISSECTION;  Surgeon: Isabel Caprice, MD;  Location: [facility];  Service: Gynecology;  Laterality: Bilateral;    Family History  Problem Relation Age of Onset  . Hypertension Father     Social History   Socioeconomic History  . Marital status: Married    Spouse name: Not on file  . Number of children: Not on file  . Years of education: Not on file  . Highest education level: Not on file  Occupational History  . Not on file  Tobacco Use  . Smoking status: Never Smoker  .  Smokeless tobacco: Never Used  Vaping Use  . Vaping Use: Never used  Substance and Sexual Activity  . Alcohol use: No  . Drug use: No  . Sexual activity: Yes    Birth control/protection: Condom  Other Topics Concern  . Not on file  Social History Narrative  . Not on file   Social Determinants of Health   Financial Resource Strain:   . Difficulty of Paying Living Expenses:   Food Insecurity:    . Worried About Charity fundraiser in the Last Year:   . Arboriculturist in the Last Year:   Transportation Needs:   . Film/video editor (Medical):   Marland Kitchen Lack of Transportation (Non-Medical):   Physical Activity:   . Days of Exercise per Week:   . Minutes of Exercise per Session:   Stress:   . Feeling of Stress :   Social Connections:   . Frequency of Communication with Friends and Family:   . Frequency of Social Gatherings with Friends and Family:   . Attends Religious Services:   . Active Member of Clubs or Organizations:   . Attends Archivist Meetings:   Marland Kitchen Marital Status:     Current Medications: No current outpatient medications on file.  Review of Systems: Denies appetite changes, fevers, chills, fatigue, unexplained weight changes. Denies hearing loss, neck lumps or masses, mouth sores, ringing in ears or voice changes. Denies cough or wheezing.  Denies shortness of breath. Denies chest pain or palpitations. Denies leg swelling. Denies abdominal distention, pain, blood in stools, constipation, diarrhea, nausea, vomiting, or early satiety. Denies pain with intercourse, dysuria, frequency, hematuria or incontinence. Denies hot flashes, pelvic pain, vaginal bleeding or vaginal discharge.   Denies joint pain, back pain or muscle pain/cramps. Denies itching, rash, or wounds. Denies dizziness, headaches, numbness or seizures. Denies swollen lymph nodes or glands, denies easy bruising or bleeding. Denies anxiety, depression, confusion, or decreased concentration.  Physical Exam: There were no vitals taken for this visit. General: ***Alert, oriented, no acute distress. HEENT: ***Posterior oropharynx clear, sclera anicteric. Chest: ***Clear to auscultation bilaterally.  ***Port site clean. Cardiovascular: ***Regular rate and rhythm, no murmurs. Abdomen: ***Obese, soft, nontender.  Normoactive bowel sounds.  No masses or hepatosplenomegaly appreciated.   ***Well-healed scar. Extremities: ***Grossly normal range of motion.  Warm, well perfused.  No edema bilaterally. Skin: ***No rashes or lesions noted. Lymphatics: ***No cervical, supraclavicular, or inguinal adenopathy. GU: Normal appearing external genitalia without erythema, excoriation, or lesions.  Speculum exam reveals ***.  Bimanual exam reveals ***.  ***Rectovaginal exam  confirms ___.  Laboratory & Radiologic Studies: 2/26 Pap - negative HPV 16/18/45 neg, positive for HR other ECC - benign squamous epithelium  Assessment & Plan: Kim Sanders is a 29 y.o. 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  Patient is overall doing well and is NED.   She is now 2 years out from surgery and has been followed every 3 months.  We will plan to move to every 43-month visits and annual Pap and ECC, which was performed today.  I will call her with these results.    The patient and I talked about her interest in the HPV vaccine at her last visit.  She has not gone to the department of health clinic to inquire about this.  ***  *** minutes of total time was spent for this patient encounter, including preparation, face-to-face counseling with  the patient and coordination of care, and documentation of the encounter.  Jeral Pinch, MD  Division of Gynecologic Oncology  Department of Obstetrics and Gynecology  [facility]

## 2020-12-14 ENCOUNTER — Telehealth: Payer: Self-pay | Admitting: *Deleted

## 2020-12-14 NOTE — Telephone Encounter (Signed)
Attempted to reach the patient to schedule a follow up appt. No answer and unable to leave a message

## 2020-12-15 ENCOUNTER — Telehealth: Payer: Self-pay | Admitting: *Deleted

## 2020-12-15 NOTE — Telephone Encounter (Signed)
Attempted to reach the patient to schedule a follow up appt, no answer and no voicemail

## 2020-12-17 ENCOUNTER — Encounter: Payer: Self-pay | Admitting: *Deleted

## 2020-12-17 NOTE — Telephone Encounter (Signed)
Attempted to reach the patient to schedule a follow up appt, no answ er. Mailed letter

## 2021-01-13 ENCOUNTER — Telehealth: Payer: Self-pay | Admitting: *Deleted

## 2021-01-13 NOTE — Telephone Encounter (Signed)
Patient called and scheduled a follow up appt for next week with Dr Berline Lopes

## 2021-01-17 ENCOUNTER — Telehealth: Payer: Self-pay | Admitting: *Deleted

## 2021-01-17 NOTE — Telephone Encounter (Signed)
Returned the patient's call and rescheduled her appt from 3/15 to 3/4

## 2021-01-17 NOTE — Progress Notes (Deleted)
Gynecologic Oncology Return Clinic Visit  01/18/21  Reason for Visit: Surveillance in the setting of early-stage cervix cancers/pfertility sparing treatment  Treatment History: Oncology History Overview Note  Kim Sanders  had an intrapartum diagnosis ASCUS cannot rule out high-grade lesion on Pap testing in March 2018.  Normal spontaneous vaginal delivery May 2018. 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  ECC was negative   LEEP was performed on January 09, 2018   Cervical cancer [facility])  01/2018 Surgery   LEEP  Ivasive squamous cell carcinoma arising in a background of high-grade squamous intraepithelial lesion.  Lesion measured 1 mm in depth where the cervix was 9 mm in thickness with the extent of cancer of 4 mm.  Of note there was a small focus suspicious for lymphovascular invasion.  The endocervical margin was negative for squamous intraepithelial lesion or carcinoma however the ectocervical margin was focally noted to have high-grade squamous intraepithelial lesion    02/26/2018 Initial Diagnosis   Cervical cancer (HCC)   02/26/2018 Surgery   Cervical Cold Knife Cone , BPLND   The cervical cone specimen revealed benign ectocervical squamous mucosa no dysplasia or malignancy and it measured anterior and posteriorly 1.4 cm in greatest dimension by 0.8 x 0.5.  The bilateral pelvic lymph nodes were negative for malignancy.   02/26/2018 Cancer Staging   Cancer Staging Cervical cancer [facility]) Staging form: Cervix Uteri, AJCC 8th Edition - Clinical stage from 02/26/2018: FIGO Stage IA1 (cT1a1, cN0, cM0) - Signed by Lafonda Mosses, MD on 10/16/2019    02/26/2018 Cancer Staging   Staging form: Cervix Uteri, AJCC 8th Edition - Clinical stage from 02/26/2018: FIGO Stage IA1 (cT1a1, cN0, cM0) - Signed by Lafonda Mosses, MD on 10/16/2019   02/06/2019 Pathology Results   Pap ECC Pap wnl ECC Granulation tissue   07/24/2019 Pathology  Results   ECC Scant mucoinflammatory debris, benign squamous epithelium, no malignancy      Interval History: *** Doing well since her last visit.  She denies any vaginal discharge or bleeding.  Her last Depo-Provera injection was at the end of January.  She denies any pelvic or abdominal pain.  She denies any changes to her appetite.  She continues to have occasional constipation and denies any urinary symptoms.  She is not currently planning pregnancy but is interested in the option of future fertility.  Past Medical/Surgical History: Past Medical History:  Diagnosis Date  . Cervical cancer [facility])     Past Surgical History:  Procedure Laterality Date  . CERVICAL CONIZATION W/BX N/A 02/26/2018   Procedure: COLD KNIFE CONIZATION OF CERVIX;  Surgeon: Isabel Caprice, MD;  Location: [facility];  Service: Gynecology;  Laterality: N/A;  . DILATION AND CURETTAGE OF UTERUS    . LEEP    . ROBOTIC PELVIC AND PARA-AORTIC LYMPH NODE DISSECTION Bilateral 02/26/2018   Procedure: XI ROBOTIC BILATERAL PELVIC  LYMPH NODE DISSECTION;  Surgeon: Isabel Caprice, MD;  Location: [facility];  Service: Gynecology;  Laterality: Bilateral;    Family History  Problem Relation Age of Onset  . Hypertension Father     Social History   Socioeconomic History  . Marital status: Married    Spouse name: Not on file  . Number of children: Not on file  . Years of education: Not on file  . Highest education level: Not on file  Occupational History  . Not on file  Tobacco Use  . Smoking status: Never Smoker  . Smokeless  tobacco: Never Used  Vaping Use  . Vaping Use: Never used  Substance and Sexual Activity  . Alcohol use: No  . Drug use: No  . Sexual activity: Yes    Birth control/protection: Condom  Other Topics Concern  . Not on file  Social History Narrative  . Not on file   Social Determinants of Health   Financial Resource Strain: Not on file  Food Insecurity: Not on file  Transportation  Needs: Not on file  Physical Activity: Not on file  Stress: Not on file  Social Connections: Not on file    Current Medications: No current outpatient medications on file.  Review of Systems: Denies appetite changes, fevers, chills, fatigue, unexplained weight changes. Denies hearing loss, neck lumps or masses, mouth sores, ringing in ears or voice changes. Denies cough or wheezing.  Denies shortness of breath. Denies chest pain or palpitations. Denies leg swelling. Denies abdominal distention, pain, blood in stools, constipation, diarrhea, nausea, vomiting, or early satiety. Denies pain with intercourse, dysuria, frequency, hematuria or incontinence. Denies hot flashes, pelvic pain, vaginal bleeding or vaginal discharge.   Denies joint pain, back pain or muscle pain/cramps. Denies itching, rash, or wounds. Denies dizziness, headaches, numbness or seizures. Denies swollen lymph nodes or glands, denies easy bruising or bleeding. Denies anxiety, depression, confusion, or decreased concentration.  Physical Exam: There were no vitals taken for this visit. General: ***Alert, oriented, no acute distress. HEENT: ***Posterior oropharynx clear, sclera anicteric. Chest: ***Clear to auscultation bilaterally.  ***Port site clean. Cardiovascular: ***Regular rate and rhythm, no murmurs. Abdomen: ***Obese, soft, nontender.  Normoactive bowel sounds.  No masses or hepatosplenomegaly appreciated.  ***Well-healed scar. Extremities: ***Grossly normal range of motion.  Warm, well perfused.  No edema bilaterally. Skin: ***No rashes or lesions noted. Lymphatics: ***No cervical, supraclavicular, or inguinal adenopathy. GU: Normal appearing external genitalia without erythema, excoriation, or lesions.  Speculum exam reveals ***.  Bimanual exam reveals ***.  ***Rectovaginal exam  confirms ___.  Laboratory & Radiologic Studies: ECC from 01/2020: A. ENDOCERVIX, CURETTAGE:  - Benign squamous epithelium.    Pap: negative, HPV risk positive (16/18/45 negative)  Assessment & Plan: Kim Sanders is a 30 y.o. 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  Patient is overall doing well and is NED.   She is now 2 years out from surgery and has been followed every 3 months.  We will plan to move to every 63-month visits and annual Pap and ECC, which was performed today.  I will call her with these results.    The patient and I talked about her interest in the HPV vaccine at her last visit.  She has not gone to the department of health clinic to inquire about this.  ***  *** minutes of total time was spent for this patient encounter, including preparation, face-to-face counseling with the patient and coordination of care, and documentation of the encounter.  Jeral Pinch, MD  Division of Gynecologic Oncology  Department of Obstetrics and Gynecology  [facility] of [facility]

## 2021-01-18 ENCOUNTER — Ambulatory Visit: Payer: Self-pay | Admitting: Gynecologic Oncology

## 2021-02-04 ENCOUNTER — Inpatient Hospital Stay: Payer: Self-pay | Attending: Gynecologic Oncology | Admitting: Gynecologic Oncology

## 2021-02-04 ENCOUNTER — Other Ambulatory Visit: Payer: Self-pay

## 2021-02-04 ENCOUNTER — Other Ambulatory Visit (HOSPITAL_COMMUNITY)
Admission: RE | Admit: 2021-02-04 | Discharge: 2021-02-04 | Disposition: A | Discharge status: Home / Self Care | Payer: Self-pay | Source: Ambulatory Visit | Attending: Gynecologic Oncology | Admitting: Gynecologic Oncology

## 2021-02-04 VITALS — BP 121/74 | HR 77 | Temp 97.7°F | Resp 18 | Ht 64.0 in | Wt 161.6 lb

## 2021-02-04 DIAGNOSIS — C530 Malignant neoplasm of endocervix: Secondary | ICD-10-CM | POA: Insufficient documentation

## 2021-02-04 DIAGNOSIS — Z1151 Encounter for screening for human papillomavirus (HPV): Secondary | ICD-10-CM

## 2021-02-04 DIAGNOSIS — Z8541 Personal history of malignant neoplasm of cervix uteri: Secondary | ICD-10-CM

## 2021-02-04 NOTE — Addendum Note (Signed)
Addended by: Baruch Merl on: 02/04/2021 04:14 PM   Modules accepted: Orders

## 2021-02-04 NOTE — Progress Notes (Signed)
Gynecologic Oncology Return Clinic Visit  02/04/21  Reason for Visit: Surveillance visit in the setting of fertility sparing surgery for early stage cervical cancer  Treatment History: Oncology History Overview Note  Kim Sanders  had an intrapartum diagnosis ASCUS cannot rule out high-grade lesion on Pap testing in March 2018.  Normal spontaneous vaginal delivery May 2018. 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  ECC was negative   LEEP was performed on January 09, 2018   Cervical cancer [facility])  01/2018 Surgery   LEEP  Ivasive squamous cell carcinoma arising in a background of high-grade squamous intraepithelial lesion.  Lesion measured 1 mm in depth where the cervix was 9 mm in thickness with the extent of cancer of 4 mm.  Of note there was a small focus suspicious for lymphovascular invasion.  The endocervical margin was negative for squamous intraepithelial lesion or carcinoma however the ectocervical margin was focally noted to have high-grade squamous intraepithelial lesion    02/26/2018 Initial Diagnosis   Cervical cancer (HCC)   02/26/2018 Surgery   Cervical Cold Knife Cone , BPLND   The cervical cone specimen revealed benign ectocervical squamous mucosa no dysplasia or malignancy and it measured anterior and posteriorly 1.4 cm in greatest dimension by 0.8 x 0.5.  The bilateral pelvic lymph nodes were negative for malignancy.   02/26/2018 Cancer Staging   Cancer Staging Cervical cancer [facility]) Staging form: Cervix Uteri, AJCC 8th Edition - Clinical stage from 02/26/2018: FIGO Stage IA1 (cT1a1, cN0, cM0) - Signed by Lafonda Mosses, MD on 10/16/2019    02/26/2018 Cancer Staging   Staging form: Cervix Uteri, AJCC 8th Edition - Clinical stage from 02/26/2018: FIGO Stage IA1 (cT1a1, cN0, cM0) - Signed by Lafonda Mosses, MD on 10/16/2019   02/06/2019 Pathology Results   Pap ECC Pap wnl ECC Granulation tissue   07/24/2019  Pathology Results   ECC Scant mucoinflammatory debris, benign squamous epithelium, no malignancy    01/30/2020 Pathology Results   PAP normal, HPV risk +, 16/18 negative ECC negative     Interval History: Patient reports doing well since her last visit.  She denies any new symptoms including intermenstrual bleeding, discharge, or pelvic pain.  She reports continuing to have normal monthly menses.  She reports regular bowel and bladder function.  Past Medical/Surgical History: Past Medical History:  Diagnosis Date  . Cervical cancer [facility])     Past Surgical History:  Procedure Laterality Date  . CERVICAL CONIZATION W/BX N/A 02/26/2018   Procedure: COLD KNIFE CONIZATION OF CERVIX;  Surgeon: Isabel Caprice, MD;  Location: [facility];  Service: Gynecology;  Laterality: N/A;  . DILATION AND CURETTAGE OF UTERUS    . LEEP    . ROBOTIC PELVIC AND PARA-AORTIC LYMPH NODE DISSECTION Bilateral 02/26/2018   Procedure: XI ROBOTIC BILATERAL PELVIC  LYMPH NODE DISSECTION;  Surgeon: Isabel Caprice, MD;  Location: [facility];  Service: Gynecology;  Laterality: Bilateral;    Family History  Problem Relation Age of Onset  . Hypertension Father     Social History   Socioeconomic History  . Marital status: Married    Spouse name: Not on file  . Number of children: Not on file  . Years of education: Not on file  . Highest education level: Not on file  Occupational History  . Not on file  Tobacco Use  . Smoking status: Never Smoker  . Smokeless tobacco: Never Used  Vaping Use  . Vaping Use: Never used  Substance and Sexual Activity  . Alcohol use: No  . Drug use: No  . Sexual activity: Yes    Birth control/protection: Condom  Other Topics Concern  . Not on file  Social History Narrative  . Not on file   Social Determinants of Health   Financial Resource Strain: Not on file  Food Insecurity: Not on file  Transportation Needs: Not on file  Physical Activity: Not on file  Stress: Not  on file  Social Connections: Not on file    Current Medications: No current outpatient medications on file.  Review of Systems: Denies appetite changes, fevers, chills, fatigue, unexplained weight changes. Denies hearing loss, neck lumps or masses, mouth sores, ringing in ears or voice changes. Denies cough or wheezing.  Denies shortness of breath. Denies chest pain or palpitations. Denies leg swelling. Denies abdominal distention, pain, blood in stools, constipation, diarrhea, nausea, vomiting, or early satiety. Denies pain with intercourse, dysuria, frequency, hematuria or incontinence. Denies hot flashes, pelvic pain, vaginal bleeding or vaginal discharge.   Denies joint pain, back pain or muscle pain/cramps. Denies itching, rash, or wounds. Denies dizziness, headaches, numbness or seizures. Denies swollen lymph nodes or glands, denies easy bruising or bleeding. Denies anxiety, depression, confusion, or decreased concentration.  Physical Exam: BP 121/74 (BP Location: Left Arm, Patient Position: Sitting)   Pulse 77   Temp 97.7 F (36.5 C) (Tympanic)   Resp 18   Ht 5\' 4"  (1.626 m)   Wt 161 lb 9.6 oz (73.3 kg)   SpO2 100%   BMI 27.74 kg/m  General: Alert, oriented, no acute distress. HEENT: Normocephalic, atraumatic, sclera anicteric. Chest: Unlabored breathing on room air. Abdomen: soft, nontender.  Normoactive bowel sounds.  No masses or hepatosplenomegaly appreciated.  Well-healed incisions. Extremities: Grossly normal range of motion.  Warm, well perfused.  No edema bilaterally. Skin: No rashes or lesions noted. Lymphatics: No cervical, supraclavicular, or inguinal adenopathy. GU: Normal appearing external genitalia without erythema, excoriation, or lesions.  Speculum exam reveals normal-appearing cervix with post cone changes, more prominent anterior lip of the cervix.  Pap test and ECC collected.  Bimanual exam reveals cervix normal, no masses, nodularity, or firmness.   Uterus is small and mobile.  Rectovaginal exam confirms these findings, no nodularity or parametrial involvement.  Laboratory & Radiologic Studies: None new  Assessment & Plan: Kim Sanders is a 30 y.o. 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  Patient is overall doing well and is NED.   She is now 3 years out from surgery and doing well.  We will continue with 70-month visits and annual Pap and ECC, which was performed today.  I will call her with these results.    We discussed signs and symptoms that would be concerning for possible disease recurrence, and I encouraged the patient to call me if she develops any of these between now and her next visit.  28 minutes of total time was spent for this patient encounter, including preparation, face-to-face counseling with the patient and coordination of care, and documentation of the encounter.  Jeral Pinch, MD  Division of Gynecologic Oncology  Department of Obstetrics and Gynecology  [facility] of [facility]

## 2021-02-04 NOTE — Patient Instructions (Signed)
Gusto verle. Todo parace normal! Voy a llamarle con los resultados de su papniculado y biopsia la semana que viene.  Nos vemos en 6 meses. Si algo cambia antes de su proxima cita en septiembre, por favor, llame la clinica para verme.

## 2021-02-08 LAB — SURGICAL PATHOLOGY

## 2021-02-09 ENCOUNTER — Telehealth: Payer: Self-pay

## 2021-02-09 LAB — CYTOLOGY - PAP
Adequacy: ABSENT
Comment: NEGATIVE
Diagnosis: NEGATIVE
High risk HPV: NEGATIVE

## 2021-02-09 NOTE — Telephone Encounter (Signed)
Via Spanish interpreter Rodman Key [id number] number is not accepting calls

## 2022-01-13 ENCOUNTER — Other Ambulatory Visit (HOSPITAL_COMMUNITY)
Admission: RE | Admit: 2022-01-13 | Discharge: 2022-01-13 | Disposition: A | Discharge status: Home / Self Care | Payer: Self-pay | Source: Ambulatory Visit | Attending: Gynecologic Oncology | Admitting: Gynecologic Oncology

## 2022-01-13 ENCOUNTER — Other Ambulatory Visit: Payer: Self-pay

## 2022-01-13 ENCOUNTER — Inpatient Hospital Stay: Payer: Self-pay | Attending: Gynecologic Oncology | Admitting: Gynecologic Oncology

## 2022-01-13 ENCOUNTER — Encounter: Payer: Self-pay | Admitting: Gynecologic Oncology

## 2022-01-13 VITALS — BP 114/73 | HR 72 | Temp 98.3°F | Resp 16 | Ht 65.75 in | Wt 168.0 lb

## 2022-01-13 DIAGNOSIS — K5900 Constipation, unspecified: Secondary | ICD-10-CM | POA: Insufficient documentation

## 2022-01-13 DIAGNOSIS — C530 Malignant neoplasm of endocervix: Secondary | ICD-10-CM | POA: Insufficient documentation

## 2022-01-13 DIAGNOSIS — Z9889 Other specified postprocedural states: Secondary | ICD-10-CM | POA: Insufficient documentation

## 2022-01-13 DIAGNOSIS — Z8541 Personal history of malignant neoplasm of cervix uteri: Secondary | ICD-10-CM | POA: Insufficient documentation

## 2022-01-13 NOTE — Patient Instructions (Signed)
No siento o veo evidencia de cancer en su examen.  Lllamos con los resultados de su pap.  Por favor, llame la clinica en Julio para una cita conmigo en 6 meses (en Agosto). Si tiene algun cambio (sangre entre reglas, desecho, Social research officer, government), por favor llame para verme mas temprano.

## 2022-01-13 NOTE — Progress Notes (Signed)
Gynecologic Oncology Return Clinic Visit  01/13/2022  Reason for Visit: Surveillance visit in the setting of fertility sparing surgery for early stage cervical cancer  Treatment History: Oncology History Overview Note  Kim Sanders  had an intrapartum diagnosis ASCUS cannot rule out high-grade lesion on Pap testing in March 2018.  Normal spontaneous vaginal delivery May 2018. 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  ECC was negative   LEEP was performed on January 09, 2018   Cervical cancer Kim Sanders)  01/2018 Surgery   LEEP  Ivasive squamous cell carcinoma arising in a background of high-grade squamous intraepithelial lesion.  Lesion measured 1 mm in depth where the cervix was 9 mm in thickness with the extent of cancer of 4 mm.  Of note there was a small focus suspicious for lymphovascular invasion.  The endocervical margin was negative for squamous intraepithelial lesion or carcinoma however the ectocervical margin was focally noted to have high-grade squamous intraepithelial lesion    02/26/2018 Initial Diagnosis   Cervical cancer (HCC)   02/26/2018 Surgery   Cervical Cold Knife Cone , BPLND   The cervical cone specimen revealed benign ectocervical squamous mucosa no dysplasia or malignancy and it measured anterior and posteriorly 1.4 cm in greatest dimension by 0.8 x 0.5.  The bilateral pelvic lymph nodes were negative for malignancy.   02/26/2018 Cancer Staging   Cancer Staging Cervical cancer [facility]) Staging form: Cervix Uteri, AJCC 8th Edition - Clinical stage from 02/26/2018: FIGO Stage IA1 (cT1a1, cN0, cM0) - Signed by Lafonda Mosses, MD on 10/16/2019    02/26/2018 Cancer Staging   Staging form: Cervix Uteri, AJCC 8th Edition - Clinical stage from 02/26/2018: FIGO Stage IA1 (cT1a1, cN0, cM0) - Signed by Lafonda Mosses, MD on 10/16/2019    02/06/2019 Pathology Results   Pap ECC Pap wnl ECC Granulation tissue   07/24/2019  Pathology Results   ECC Scant mucoinflammatory debris, benign squamous epithelium, no malignancy    01/30/2020 Pathology Results   PAP normal, HPV risk +, 16/18 negative ECC negative     Interval History: Patient reports overall doing well.  She endorses regular menses without any intermenstrual spotting.  She has some normal cyclic discharge with her periods, otherwise denies any discharge.  Struggles intermittently with constipation which she treats with fiber.  Denies any urinary symptoms.  Endorses a good appetite without nausea or emesis.  Past Medical/Surgical History: Past Medical History:  Diagnosis Date   Cervical cancer [facility])     Past Surgical History:  Procedure Laterality Date   CERVICAL CONIZATION W/BX N/A 02/26/2018   Procedure: COLD KNIFE CONIZATION OF CERVIX;  Surgeon: Isabel Caprice, MD;  Location: [facility];  Service: Gynecology;  Laterality: N/A;   DILATION AND CURETTAGE OF UTERUS     LEEP     ROBOTIC PELVIC AND PARA-AORTIC LYMPH NODE DISSECTION Bilateral 02/26/2018   Procedure: XI ROBOTIC BILATERAL PELVIC  LYMPH NODE DISSECTION;  Surgeon: Isabel Caprice, MD;  Location: [facility];  Service: Gynecology;  Laterality: Bilateral;    Family History  Problem Relation Age of Onset   Hypertension Father     Social History   Socioeconomic History   Marital status: Married    Spouse name: Not on file   Number of children: Not on file   Years of education: Not on file   Highest education level: Not on file  Occupational History   Not on file  Tobacco Use   Smoking status: Never  Smokeless tobacco: Never  Vaping Use   Vaping Use: Never used  Substance and Sexual Activity   Alcohol use: No   Drug use: No   Sexual activity: Yes    Birth control/protection: Condom  Other Topics Concern   Not on file  Social History Narrative   Not on file   Social Determinants of Health   Financial Resource Strain: Not on file  Food Insecurity: Not on file   Transportation Needs: Not on file  Physical Activity: Not on file  Stress: Not on file  Social Connections: Not on file    Current Medications: No current outpatient medications on file.  Review of Systems: Denies appetite changes, fevers, chills, fatigue, unexplained weight changes. Denies hearing loss, neck lumps or masses, mouth sores, ringing in ears or voice changes. Denies cough or wheezing.  Denies shortness of breath. Denies chest pain or palpitations. Denies leg swelling. Denies abdominal distention, pain, blood in stools, constipation, diarrhea, nausea, vomiting, or early satiety. Denies pain with intercourse, dysuria, frequency, hematuria or incontinence. Denies hot flashes, pelvic pain, vaginal bleeding or vaginal discharge.   Denies joint pain, back pain or muscle pain/cramps. Denies itching, rash, or wounds. Denies dizziness, headaches, numbness or seizures. Denies swollen lymph nodes or glands, denies easy bruising or bleeding. Denies anxiety, depression, confusion, or decreased concentration.  Physical Exam: BP 114/73 (BP Location: Left Arm, Patient Position: Sitting)    Pulse 72    Temp 98.3 F (36.8 C) (Oral)    Resp 16    Ht 5' 5.75" (1.67 m)    Wt 168 lb (76.2 kg)    SpO2 100%    BMI 27.32 kg/m  General: Alert, oriented, no acute distress. HEENT: Normocephalic, atraumatic, sclera anicteric. Chest: Unlabored breathing on room air. Abdomen: soft, nontender.  Normoactive bowel sounds.  No masses or hepatosplenomegaly appreciated.   Extremities: Grossly normal range of motion.  Warm, well perfused.  No edema bilaterally. Skin: No rashes or lesions noted. Lymphatics: No cervical, supraclavicular, or inguinal adenopathy. GU: Normal appearing external genitalia without erythema, excoriation, or lesions.  Speculum exam reveals well rugated vaginal mucosa, no lesions or masses.  No bleeding or discharge.  Cervix with changes related to prior conization procedures, no  lesions noted.  Pap smear as well as ECC performed today.  Bimanual exam reveals cervix is smooth, no firmness or nodularity.  Rectovaginal exam  confirms his findings, no parametrial involvement or nodularity.  Laboratory & Radiologic Studies: 02/2021: pap negative, HR HPV negative, ECC benign  Assessment & Plan: Kim Sanders is a 31 y.o. 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   Patient is overall doing well and is NED.    She is now almost 4 years out from surgery and doing well.  We will continue with 83-month visits and annual Pap and ECC, which was performed today.  The office will call her with these results.  We will plan to transition to yearly visits when she is 5 years out from diagnosis.   We discussed signs and symptoms that would be concerning for possible disease recurrence, and I encouraged the patient to call me if she develops any of these between now and her next visit.  We discussed pregnancy plans today.  She continues to say, she did at her last visit, that she does not desire future fertility.  We have previously talked about hysterectomy.  I think that if she ultimately  decides she wants to proceed with hysterectomy this would be reasonable.  We discussed that even after hysterectomy there is a risk, albeit a small 1, of cancer recurrence at the top of the vagina.  She would need continued surveillance with yearly visits either way.  22 minutes of total time was spent for this patient encounter, including preparation, face-to-face counseling with the patient and coordination of care, and documentation of the encounter.  Jeral Pinch, MD  Division of Gynecologic Oncology  Department of Obstetrics and Gynecology  [facility] of [facility]

## 2022-01-17 ENCOUNTER — Telehealth: Payer: Self-pay

## 2022-01-17 LAB — SURGICAL PATHOLOGY

## 2022-01-17 NOTE — Telephone Encounter (Signed)
Using the [place] interpreter [phone], left a message for the patient about her pap smear results are still pending and biopsy is benign.

## 2022-01-18 ENCOUNTER — Telehealth: Payer: Self-pay | Admitting: *Deleted

## 2022-01-18 LAB — CYTOLOGY - PAP
Comment: NEGATIVE
Diagnosis: NEGATIVE
High risk HPV: NEGATIVE

## 2022-01-18 NOTE — Telephone Encounter (Signed)
Attempted to contact pt regarding pap smear results. Unable to reach pt. LVM with assistant from interpreter Lovey Newcomer [phone]) requesting a call back.

## 2022-01-24 NOTE — Telephone Encounter (Signed)
Attempted to contact pt regarding pap smear results. Used Equities trader. LVM to return phone call.

## 2022-01-26 NOTE — Telephone Encounter (Signed)
Spoke with Kim Sanders with the assistance of pacific interpreter Cletus Gash (ID# [phone]). Notified patient that pap smear is normal and biopsy is benign. Patient verbalized understanding, no questions voiced. Instructed to call with any needs.

## 2023-01-12 ENCOUNTER — Telehealth: Payer: Self-pay | Admitting: *Deleted

## 2023-01-12 NOTE — Telephone Encounter (Signed)
Patient called and scheduled a follow up appt

## 2023-01-26 ENCOUNTER — Other Ambulatory Visit: Payer: Self-pay

## 2023-01-26 ENCOUNTER — Inpatient Hospital Stay: Payer: Self-pay | Attending: Gynecologic Oncology | Admitting: Gynecologic Oncology

## 2023-01-26 ENCOUNTER — Other Ambulatory Visit (HOSPITAL_COMMUNITY)
Admission: RE | Admit: 2023-01-26 | Discharge: 2023-01-26 | Disposition: A | Discharge status: Home / Self Care | Payer: Self-pay | Source: Ambulatory Visit | Attending: Gynecologic Oncology | Admitting: Gynecologic Oncology

## 2023-01-26 VITALS — BP 121/73 | HR 79 | Temp 98.4°F | Resp 14 | Wt 171.2 lb

## 2023-01-26 DIAGNOSIS — C539 Malignant neoplasm of cervix uteri, unspecified: Secondary | ICD-10-CM

## 2023-01-26 DIAGNOSIS — C530 Malignant neoplasm of endocervix: Secondary | ICD-10-CM

## 2023-01-26 NOTE — Patient Instructions (Signed)
Your exam today was normal. We will contact you with the results of your pap smear from today.   Congrats on your 5 year anniversary!   We will plan on seeing you in the office in one year or sooner if needed. Please call our office closer to the date to schedule at [phone]

## 2023-01-29 LAB — SURGICAL PATHOLOGY

## 2023-02-02 NOTE — Progress Notes (Signed)
Gynecologic Oncology Return Clinic Visit  01/26/2023  Reason for Visit: Surveillance visit in the setting of fertility sparing surgery for early stage cervical cancer  Treatment History: Oncology History Overview Note  Kim Sanders  had an intrapartum diagnosis ASCUS cannot rule out high-grade lesion on Pap testing in March 2018.  Normal spontaneous vaginal delivery May 2018. 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  ECC was negative   LEEP was performed on January 09, 2018   Cervical cancer [facility])  01/2018 Surgery   LEEP  Ivasive squamous cell carcinoma arising in a background of high-grade squamous intraepithelial lesion.  Lesion measured 1 mm in depth where the cervix was 9 mm in thickness with the extent of cancer of 4 mm.  Of note there was a small focus suspicious for lymphovascular invasion.  The endocervical margin was negative for squamous intraepithelial lesion or carcinoma however the ectocervical margin was focally noted to have high-grade squamous intraepithelial lesion    02/26/2018 Initial Diagnosis   Cervical cancer (HCC)   02/26/2018 Surgery   Cervical Cold Knife Cone , BPLND   The cervical cone specimen revealed benign ectocervical squamous mucosa no dysplasia or malignancy and it measured anterior and posteriorly 1.4 cm in greatest dimension by 0.8 x 0.5.  The bilateral pelvic lymph nodes were negative for malignancy.   02/26/2018 Cancer Staging   Cancer Staging Cervical cancer [facility]) Staging form: Cervix Uteri, AJCC 8th Edition - Clinical stage from 02/26/2018: FIGO Stage IA1 (cT1a1, cN0, cM0) - Signed by Lafonda Mosses, MD on 10/16/2019    02/26/2018 Cancer Staging   Staging form: Cervix Uteri, AJCC 8th Edition - Clinical stage from 02/26/2018: FIGO Stage IA1 (cT1a1, cN0, cM0) - Signed by Lafonda Mosses, MD on 10/16/2019   02/06/2019 Pathology Results   Pap ECC Pap wnl ECC Granulation tissue   07/24/2019  Pathology Results   ECC Scant mucoinflammatory debris, benign squamous epithelium, no malignancy    01/30/2020 Pathology Results   PAP normal, HPV risk +, 16/18 negative ECC negative   02/04/2021:  ECC: Scant benign squamous epithelium and endocervical glandular cells  No high-grade dysplasia or malignancy identified  Pap: Negative with HPV HR neg  01/13/2022: ECC: Benign squamous mucosa Pap: Negative with HPV HR neg  Interval History: Patient reports overall doing well.  She endorses regular menses without any intermenstrual spotting with her last period on February 11. She denies breakthrough bleeding or discharge. Denies changes in bowel or bladder habits.  Endorses a good appetite without nausea or emesis. No pain reported. No concerns voiced. Patient reports no chance of being pregnant at this time prior to [facility].  Past Medical/Surgical History: Past Medical History:  Diagnosis Date   Cervical cancer [facility])     Past Surgical History:  Procedure Laterality Date   CERVICAL CONIZATION W/BX N/A 02/26/2018   Procedure: COLD KNIFE CONIZATION OF CERVIX;  Surgeon: Isabel Caprice, MD;  Location: [facility];  Service: Gynecology;  Laterality: N/A;   DILATION AND CURETTAGE OF UTERUS     LEEP     ROBOTIC PELVIC AND PARA-AORTIC LYMPH NODE DISSECTION Bilateral 02/26/2018   Procedure: XI ROBOTIC BILATERAL PELVIC  LYMPH NODE DISSECTION;  Surgeon: Isabel Caprice, MD;  Location: [facility];  Service: Gynecology;  Laterality: Bilateral;    Family History  Problem Relation Age of Onset   Hypertension Father     Social History   Socioeconomic History   Marital status: Married    Spouse  name: Not on file   Number of children: Not on file   Years of education: Not on file   Highest education level: Not on file  Occupational History   Not on file  Tobacco Use   Smoking status: Never   Smokeless tobacco: Never  Vaping Use   Vaping Use: Never used  Substance and Sexual Activity   Alcohol  use: No   Drug use: No   Sexual activity: Yes    Birth control/protection: Condom  Other Topics Concern   Not on file  Social History Narrative   Not on file   Social Determinants of Health   Financial Resource Strain: Not on file  Food Insecurity: Not on file  Transportation Needs: Not on file  Physical Activity: Not on file  Stress: Not on file  Social Connections: Not on file    Current Medications: No current outpatient medications on file.  Review of Systems: Denies appetite changes, fevers, chills, fatigue, unexplained weight changes. Denies hearing loss, neck lumps or masses, mouth sores, ringing in ears or voice changes. Denies cough or wheezing.  Denies shortness of breath. Denies chest pain or palpitations. Denies leg swelling. Denies abdominal distention, pain, blood in stools, constipation, diarrhea, nausea, vomiting, or early satiety. Denies pain with intercourse, dysuria, frequency, hematuria or incontinence. Denies hot flashes, pelvic pain, vaginal bleeding or vaginal discharge.   Denies joint pain, back pain or muscle pain/cramps. Denies itching, rash, or wounds. Denies dizziness, headaches, numbness or seizures. Denies swollen lymph nodes or glands, denies easy bruising or bleeding. Denies anxiety, depression, confusion, or decreased concentration.  Physical Exam: BP 121/73 (BP Location: Left Arm, Patient Position: Sitting)   Pulse 79   Temp 98.4 F (36.9 C) (Oral)   Resp 14   Wt 171 lb 3.2 oz (77.7 kg)   SpO2 98%   BMI 27.84 kg/m  General: Alert, oriented, no acute distress. HEENT: Normocephalic, atraumatic, sclera anicteric. Chest: Lungs clear. Unlabored breathing on room air. Heart regular in rate and rhythm. Abdomen: soft, nontender.  Normoactive bowel sounds.  No masses or hepatosplenomegaly appreciated.   Extremities: Grossly normal range of motion.  Warm, well perfused.  No edema bilaterally. Skin: No rashes or lesions noted. Lymphatics: No  cervical, supraclavicular, or inguinal adenopathy. GU: Normal appearing external genitalia without erythema, excoriation, or lesions.  Speculum exam reveals well rugated vaginal mucosa, no lesions or masses.  No bleeding or discharge.  Cervix with changes related to prior conization procedures, no lesions noted.  Pap smear as well as ECC performed today.  Bimanual exam reveals cervix is smooth, no firmness or nodularity.  Rectovaginal exam confirms.  Laboratory & Radiologic Studies: Pap and ECC obtained today  Assessment & Plan: Anasia Zellmann is a 32 y.o. 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 Patient is overall doing well and is NED.    She is now almost 5 years out from surgery and doing well.  We will transition to yearly visits and annual Pap and ECC, which was performed today.  The office will call her with these results.     She is advised to call for any needs, concerns, or new concerning symptoms.  20 minutes of total time was spent for this patient encounter, including preparation, face-to-face counseling with the patient and coordination of care, and documentation of the encounter.  Joylene John, NP Gynecologic Oncology

## 2023-02-05 LAB — CYTOLOGY - PAP
Adequacy: ABSENT
Comment: NEGATIVE
Diagnosis: UNDETERMINED — AB
High risk HPV: NEGATIVE

## 2023-02-08 ENCOUNTER — Telehealth: Payer: Self-pay

## 2023-02-08 NOTE — Telephone Encounter (Signed)
[employer]ia [employer] [phone] Unable to reach Kim Sanders by phone  LVM on spouse number for pt to call office for pap smear results.

## 2023-02-08 NOTE — Telephone Encounter (Signed)
-----   Message from Dorothyann Gibbs, NP sent at 02/08/2023  8:57 AM EST ----- Would use interpreter line for this.   Please let the patient know her pap smear showed some atypical cells. With this, then we check HPV high risk which was negative. Good news. The sampling from the inner cervix showed no precancer or cancer.  The recommendation will most likely be for follow up in one year or sooner if needed. We will review results with Dr. Berline Lopes when she returns next week to make sure there is nothing else recommended etc. We will let her know.

## 2023-02-09 NOTE — Telephone Encounter (Signed)
[employer]ia [employer] [phone], Unable to leave voicemail on pt's phone (fax number) Tried husband's number and it says unable to leave message at this time. She does not have MyChart. I will try again later today.

## 2023-02-09 NOTE — Telephone Encounter (Signed)
3 attempts to reach patient, via [employer], to discuss recent pap smear results.  Pt does not have Mychart.   Dr. Berline Lopes and Joylene John NP notified

## 2023-09-03 LAB — HEPATITIS C ANTIBODY: HCV Ab: NEGATIVE

## 2023-09-03 LAB — OB RESULTS CONSOLE GC/CHLAMYDIA
Chlamydia: NEGATIVE
Neisseria Gonorrhea: NEGATIVE

## 2023-12-05 NOTE — L&D Delivery Note (Cosign Needed Addendum)
OB/GYN Faculty Practice Delivery Note  Kim Sanders is a 33 y.o. [id number] s/p NSVD at [redacted]w[redacted]d. She was admitted for IOL at [redacted]w[redacted]d due to non-reactive NST.   ROM: 0h 25m with clear fluid GBS Status: negative Maximum Maternal Temperature: 36.8 celsius  Labor Progress: Fingertip and thick on arrival 23:45 01/14/24 Cervical ripening with dual cytotec on admission Stenotic cervical os on exam, Cat II strip but reassuring Epidural at 6:40 01/15/24 Augmentation with pitocin at 8:45 01/15/24 Scar tissue of stenotic cervical os manually released 11:43 01/15/24 AROM at 14:53 Complete at 14:57 and ready to push  Delivery Date/Time: 01/15/24 15:05 Delivery: Called to room and patient was complete and feeling need to push. Pushed for one contraction and head delivered. Shoulder and body delivered in usual fashion. Nuchal cord present as well as cord around body and leg. Unable to reduce after delivery. Cord clamped ~30 seconds after delivery to relieve nuchal cord, and umbilical stump cut by support person. Infant with spontaneous cry, placed on mother's abdomen, dried and stimulated. Cord blood drawn. Placenta delivered spontaneously with gentle cord traction. Fundus firm with massage and Pitocin. Labia, perineum, vagina, and cervix inspected without any lacerations.   Placenta: intact with 3 vessel cord Complications: nuchal cord as well as cord around body and leg Lacerations: none EBL: 48 cc Analgesia: epidural   Postpartum Planning [x]  message to sent to schedule follow-up  [x]  vaccines UTD Infant: girl  APGARs 9, 9  weight not yet obtained  Katie L. De Burrs, MD Family Medicine Resident, PGY-1 01/15/2024 3:20 PM  Attestation of Supervision of Student: I was present, gloved, and participated in the entirety of delivery and third stage.  Joanne Gavel, MD OB Fellow 01/15/2024 4:53 PM

## 2023-12-05 NOTE — L&D Delivery Note (Signed)
OB/GYN Faculty Practice Delivery Note  Kim Sanders is a 33 y.o. [id number] s/p NSVD at [redacted]w[redacted]d. She was admitted for IOL at [redacted]w[redacted]d due to NRNST. A virtual interpreter was used for this encounter.  ROM: 0h 50m with clear fluid GBS Status: negative Maximum Maternal Temperature: 36.8 celsius  Labor Progress: Fingertip and thick on arrival 23:45 01/14/24 Cervical ripening with dual cytotec on admission Stenotic cervical os on exam, Cat II strip but reassuring Epidural at 6:40 01/15/24 Augmentation with pitocin at 8:45 01/15/24 Scar tissue of stenotic cervical os manually released 11:43 01/15/24 SROM at 14:53 Complete at 14:57 and ready to push  Delivery Date/Time: 01/15/24 15:05 Delivery: Called to room and patient was complete and pushing. Head delivered. Nuchal cord present as well as cord around body and leg. Shoulder and body delivered in usual fashion. Infant with spontaneous cry, placed on mother's abdomen, dried and stimulated. Cord clamped immediately after delivery to relieve nuchal cord, and umbilical stump cut by support person. Cord blood drawn. Placenta delivered spontaneously with gentle cord traction. Fundus firm with massage and Pitocin. Labia, perineum, vagina, and cervix inspected inspected.   Placenta: in tact with 3 vessel cord Complications: nuchal cord as well as cord around body and leg Lacerations: none EBL: 48 cc Analgesia: epidural   Postpartum Planning [ ]  message to sent to schedule follow-up  [x]  vaccines UTD Infant: girl  APGARs 9, 9  weight not yet obtained  [name], MD Family Medicine Resident, PGY-1 01/15/2024 4:35 PM

## 2023-12-17 LAB — OB RESULTS CONSOLE GC/CHLAMYDIA
Chlamydia: NEGATIVE
Neisseria Gonorrhea: NEGATIVE

## 2023-12-17 LAB — OB RESULTS CONSOLE GBS: GBS: NEGATIVE

## 2024-01-08 ENCOUNTER — Ambulatory Visit: Payer: Self-pay

## 2024-01-14 ENCOUNTER — Encounter (HOSPITAL_COMMUNITY): Payer: Self-pay

## 2024-01-14 ENCOUNTER — Other Ambulatory Visit: Payer: Self-pay

## 2024-01-14 ENCOUNTER — Inpatient Hospital Stay (HOSPITAL_COMMUNITY): Admission: RE | Admit: 2024-01-14 | Payer: Self-pay | Source: Home / Self Care | Admitting: Family Medicine

## 2024-01-14 ENCOUNTER — Inpatient Hospital Stay (HOSPITAL_COMMUNITY)
Admission: AD | Admit: 2024-01-14 | Discharge: 2024-01-16 | DRG: 807 | Disposition: A | Discharge status: Home / Self Care | Payer: Self-pay | Attending: Obstetrics and Gynecology | Admitting: Obstetrics and Gynecology

## 2024-01-14 DIAGNOSIS — Z8541 Personal history of malignant neoplasm of cervix uteri: Secondary | ICD-10-CM

## 2024-01-14 DIAGNOSIS — Z3A40 40 weeks gestation of pregnancy: Secondary | ICD-10-CM

## 2024-01-14 DIAGNOSIS — Z8249 Family history of ischemic heart disease and other diseases of the circulatory system: Secondary | ICD-10-CM

## 2024-01-14 DIAGNOSIS — Z603 Acculturation difficulty: Secondary | ICD-10-CM | POA: Diagnosis present

## 2024-01-14 DIAGNOSIS — O288 Other abnormal findings on antenatal screening of mother: Principal | ICD-10-CM | POA: Diagnosis present

## 2024-01-14 LAB — CBC
HCT: 36.1 % (ref 36.0–46.0)
Hemoglobin: 12.5 g/dL (ref 12.0–15.0)
MCH: 29.9 pg (ref 26.0–34.0)
MCHC: 34.6 g/dL (ref 30.0–36.0)
MCV: 86.4 fL (ref 80.0–100.0)
Platelets: 179 10*3/uL (ref 150–400)
RBC: 4.18 MIL/uL (ref 3.87–5.11)
RDW: 13.2 % (ref 11.5–15.5)
WBC: 8.4 10*3/uL (ref 4.0–10.5)
nRBC: 0 % (ref 0.0–0.2)

## 2024-01-14 LAB — TYPE AND SCREEN
ABO/RH(D): O POS
Antibody Screen: NEGATIVE

## 2024-01-14 MED ORDER — LIDOCAINE HCL (PF) 1 % IJ SOLN: 30.0000 mL | INTRAMUSCULAR | Status: DC | PRN

## 2024-01-14 MED ORDER — OXYTOCIN-SODIUM CHLORIDE 30-0.9 UT/500ML-% IV SOLN
2.5000 [IU]/h | INTRAVENOUS | Status: DC
  Administered 2024-01-15: 2.5 [IU]/h via INTRAVENOUS
  Filled 2024-01-14: qty 500

## 2024-01-14 MED ORDER — ONDANSETRON HCL 4 MG/2ML IJ SOLN: 4.0000 mg | Freq: Four times a day (QID) | INTRAMUSCULAR | Status: DC | PRN

## 2024-01-14 MED ORDER — LACTATED RINGERS IV SOLN: INTRAVENOUS | Status: DC

## 2024-01-14 MED ORDER — LACTATED RINGERS IV SOLN
500.0000 mL | INTRAVENOUS | Status: DC | PRN
  Administered 2024-01-15: 500 mL via INTRAVENOUS

## 2024-01-14 MED ORDER — OXYTOCIN BOLUS FROM INFUSION
333.0000 mL | Freq: Once | INTRAVENOUS | Status: AC
  Administered 2024-01-15: 333 mL via INTRAVENOUS

## 2024-01-14 MED ORDER — ACETAMINOPHEN 325 MG PO TABS: 650.0000 mg | ORAL_TABLET | ORAL | Status: DC | PRN

## 2024-01-14 MED ORDER — FENTANYL CITRATE (PF) 100 MCG/2ML IJ SOLN
50.0000 ug | INTRAMUSCULAR | Status: DC | PRN
  Administered 2024-01-15: 100 ug via INTRAVENOUS
  Filled 2024-01-14: qty 2

## 2024-01-14 MED ORDER — SOD CITRATE-CITRIC ACID 500-334 MG/5ML PO SOLN: 30.0000 mL | ORAL | Status: DC | PRN

## 2024-01-14 MED ORDER — OXYCODONE-ACETAMINOPHEN 5-325 MG PO TABS: 2.0000 | ORAL_TABLET | ORAL | Status: DC | PRN

## 2024-01-14 MED ORDER — MISOPROSTOL 25 MCG QUARTER TABLET
25.0000 ug | ORAL_TABLET | Freq: Once | ORAL | Status: AC
  Administered 2024-01-14: 25 ug via VAGINAL
  Filled 2024-01-14: qty 1

## 2024-01-14 MED ORDER — MISOPROSTOL 50MCG HALF TABLET
50.0000 ug | ORAL_TABLET | Freq: Once | ORAL | Status: AC
  Administered 2024-01-14: 50 ug via ORAL
  Filled 2024-01-14: qty 1

## 2024-01-14 MED ORDER — TERBUTALINE SULFATE 1 MG/ML IJ SOLN
0.2500 mg | Freq: Once | INTRAMUSCULAR | Status: AC | PRN
  Administered 2024-01-15: 0.25 mg via SUBCUTANEOUS
  Filled 2024-01-14: qty 1

## 2024-01-14 MED ORDER — OXYCODONE-ACETAMINOPHEN 5-325 MG PO TABS: 1.0000 | ORAL_TABLET | ORAL | Status: DC | PRN

## 2024-01-14 NOTE — H&P (Signed)
 OBSTETRIC ADMISSION HISTORY AND PHYSICAL  Kim Sanders is a 33 y.o. female [phone] with IUP at [redacted]w[redacted]d (dated by early [place], Estimated Date of Delivery: 01/11/24) presenting for IOL due to non-reactive NST.   She reports +FMs, No LOF, no VB, no blurry vision, headaches or peripheral edema, and RUQ pain.    She plans on breast feeding. She request Depo for birth control.  She received her prenatal care at [facility]   Prenatal History/Complications:  - Hx cervical cancer s/p fertility sparing CKC and LND in 2019   Past Medical History: Past Medical History:  Diagnosis Date   Cervical cancer [facility])     Past Surgical History: Past Surgical History:  Procedure Laterality Date   CERVICAL CONIZATION W/BX N/A 02/26/2018   Procedure: COLD KNIFE CONIZATION OF CERVIX;  Surgeon: Shonna Chock, MD;  Location: [facility];  Service: Gynecology;  Laterality: N/A;   DILATION AND CURETTAGE OF UTERUS     LEEP     ROBOTIC PELVIC AND PARA-AORTIC LYMPH NODE DISSECTION Bilateral 02/26/2018   Procedure: XI ROBOTIC BILATERAL PELVIC  LYMPH NODE DISSECTION;  Surgeon: Shonna Chock, MD;  Location: [facility];  Service: Gynecology;  Laterality: Bilateral;    Obstetrical History: OB History     Gravida  6   Para  3   Term  3   Preterm      AB  2   Living  3      SAB  2   IAB      Ectopic      Multiple  0   Live Births  3           Social History Social History   Socioeconomic History   Marital status: Married    Spouse name: Not on file   Number of children: Not on file   Years of education: Not on file   Highest education level: Not on file  Occupational History   Not on file  Tobacco Use   Smoking status: Never   Smokeless tobacco: Never  Vaping Use   Vaping status: Never Used  Substance and Sexual Activity   Alcohol use: No   Drug use: No   Sexual activity: Yes    Birth control/protection: Condom  Other Topics Concern   Not on file  Social History Narrative    Not on file   Social Drivers of Health   Financial Resource Strain: Not on file  Food Insecurity: No Food Insecurity (01/14/2024)   Hunger Vital Sign    Worried About Running Out of Food in the Last Year: Never true    Ran Out of Food in the Last Year: Never true  Transportation Needs: No Transportation Needs (01/14/2024)   PRAPARE - Administrator, Civil Service (Medical): No    Lack of Transportation (Non-Medical): No  Physical Activity: Not on file  Stress: Not on file  Social Connections: Not on file    Family History: Family History  Problem Relation Age of Onset   Hypertension Father     Allergies: No Known Allergies  Medications Prior to Admission  Medication Sig Dispense Refill Last Dose/Taking   Prenatal Vit-Fe Fumarate-FA (PRENATAL MULTIVITAMIN) TABS tablet Take 1 tablet by mouth daily at 12 noon.   01/13/2024     Review of Systems  All systems reviewed and negative except as stated in HPI.  Blood pressure 116/74, pulse 80, temperature 98 F (36.7 C), temperature source Oral, resp. rate 17. General appearance:  alert and cooperative Lungs: breathing comfortably on room air Heart: regular rate Abdomen: soft, non-tender; gravid Extremities: no edema of bilateral lower extremities Presentation: cephalic by BSUS Fetal monitoring: 145/mod/+a/-d Uterine activity: None Dilation: Fingertip Effacement (%): Thick Exam by:: Lucianne Muss MD   Prenatal labs: ABO, Rh: --/--/O POS (02/10 2010) Antibody: NEG (02/10 2010) Rubella:  Immune RPR:  NR HBsAg:  NR HIV:   NR GBS:   Neg Passed 1' GTT Genetic screening  MSAFP Neg, NIPS done, but report not in chart Anatomy US ant placenta, EFW 96%tile, vertex, AFI 15.6cm Last [place]: At [redacted]w[redacted]d - cephalic presentation, EFW 819g (49 %tile)  Prenatal Transfer Tool  Maternal Diabetes: No Genetic Screening: Normal msAFP Maternal Ultrasounds/Referrals: Normal Fetal Ultrasounds or other Referrals:  None Maternal Substance  Abuse:  No Significant Maternal Medications:  None Significant Maternal Lab Results:  Group B Strep negative Number of Prenatal Visits:greater than 3 verified prenatal visits Other Comments:  None  Results for orders placed or performed during the hospital encounter of 01/14/24 (from the past 24 hours)  Type and screen [facility]   Collection Time: 01/14/24  8:10 PM  Result Value Ref Range   ABO/RH(D) O POS    Antibody Screen NEG    Sample Expiration      01/17/2024,2359 Performed at [facility], [address], [place], [place] [address]   CBC   Collection Time: 01/14/24  8:25 PM  Result Value Ref Range   WBC 8.4 4.0 - 10.5 K/uL   RBC 4.18 3.87 - 5.11 MIL/uL   Hemoglobin 12.5 12.0 - 15.0 g/dL   HCT 19.1 47.8 - 29.5 %   MCV 86.4 80.0 - 100.0 fL   MCH 29.9 26.0 - 34.0 pg   MCHC 34.6 30.0 - 36.0 g/dL   RDW 62.1 30.8 - 65.7 %   Platelets 179 150 - 400 K/uL   nRBC 0.0 0.0 - 0.2 %    Patient Active Problem List   Diagnosis Date Noted   NST (non-stress test) nonreactive 01/14/2024   Cervical cancer (HCC) 02/26/2018    Assessment/Plan:  Kim Sanders is a 33 y.o. [id number] at [redacted]w[redacted]d here for IOL due to NRNST  #Labor: Discussed rationale for IOL in detail w pt  pt's cervix FT/thick  will start with dual cytotec and plan to recheck in 4h #Pain: Per pt request, interested in epidural #FWB: Cat I #ID:  GBS neg #MOF: Breast #MOC: Depo #Circ:  N/A  #Hx cervical cancer s/p fertility sparing CKC and primary LND in 2019  plan for annual pap and ECC  f/b gyn/onc  #Psychosocial: Multiple recent stressors  TOC c/s after delivery  Sundra Aland, MD OB Fellow, Faculty Practice [facility], [facility] 01/15/24 11:45 PM

## 2024-01-15 ENCOUNTER — Inpatient Hospital Stay (HOSPITAL_COMMUNITY): Payer: Self-pay | Admitting: Anesthesiology

## 2024-01-15 ENCOUNTER — Encounter (HOSPITAL_COMMUNITY): Payer: Self-pay | Admitting: Obstetrics & Gynecology

## 2024-01-15 DIAGNOSIS — Z3A40 40 weeks gestation of pregnancy: Secondary | ICD-10-CM

## 2024-01-15 DIAGNOSIS — O368330 Maternal care for abnormalities of the fetal heart rate or rhythm, third trimester, not applicable or unspecified: Secondary | ICD-10-CM

## 2024-01-15 DIAGNOSIS — O480 Post-term pregnancy: Secondary | ICD-10-CM

## 2024-01-15 LAB — RPR: RPR Ser Ql: NONREACTIVE

## 2024-01-15 MED ORDER — SODIUM CHLORIDE 0.9% FLUSH: 3.0000 mL | Freq: Two times a day (BID) | INTRAVENOUS | Status: DC

## 2024-01-15 MED ORDER — COCONUT OIL OIL: 1.0000 | TOPICAL_OIL | Status: DC | PRN

## 2024-01-15 MED ORDER — ACETAMINOPHEN 325 MG PO TABS: 650.0000 mg | ORAL_TABLET | ORAL | Status: DC | PRN

## 2024-01-15 MED ORDER — WITCH HAZEL-GLYCERIN EX PADS: 1.0000 | MEDICATED_PAD | CUTANEOUS | Status: DC | PRN

## 2024-01-15 MED ORDER — SODIUM CHLORIDE 0.9 % IV SOLN: 250.0000 mL | INTRAVENOUS | Status: DC | PRN

## 2024-01-15 MED ORDER — SIMETHICONE 80 MG PO CHEW: 80.0000 mg | CHEWABLE_TABLET | ORAL | Status: DC | PRN

## 2024-01-15 MED ORDER — ONDANSETRON HCL 4 MG/2ML IJ SOLN: 4.0000 mg | INTRAMUSCULAR | Status: DC | PRN

## 2024-01-15 MED ORDER — PHENYLEPHRINE 80 MCG/ML (10ML) SYRINGE FOR IV PUSH (FOR BLOOD PRESSURE SUPPORT): 80.0000 ug | PREFILLED_SYRINGE | INTRAVENOUS | Status: DC | PRN

## 2024-01-15 MED ORDER — FENTANYL-BUPIVACAINE-NACL 0.5-0.125-0.9 MG/250ML-% EP SOLN
12.0000 mL/h | EPIDURAL | Status: DC | PRN
  Administered 2024-01-15: 12 mL/h via EPIDURAL
  Filled 2024-01-15: qty 250

## 2024-01-15 MED ORDER — LACTATED RINGERS IV SOLN: 500.0000 mL | Freq: Once | INTRAVENOUS | Status: DC

## 2024-01-15 MED ORDER — DIPHENHYDRAMINE HCL 25 MG PO CAPS: 25.0000 mg | ORAL_CAPSULE | Freq: Four times a day (QID) | ORAL | Status: DC | PRN

## 2024-01-15 MED ORDER — EPHEDRINE 5 MG/ML INJ: 10.0000 mg | INTRAVENOUS | Status: DC | PRN

## 2024-01-15 MED ORDER — SENNOSIDES-DOCUSATE SODIUM 8.6-50 MG PO TABS
2.0000 | ORAL_TABLET | ORAL | Status: DC
  Administered 2024-01-16: 2 via ORAL
  Filled 2024-01-15: qty 2

## 2024-01-15 MED ORDER — MEASLES, MUMPS & RUBELLA VAC IJ SOLR: 0.5000 mL | Freq: Once | INTRAMUSCULAR | Status: DC

## 2024-01-15 MED ORDER — LIDOCAINE HCL (PF) 1 % IJ SOLN
INTRAMUSCULAR | Status: DC | PRN
  Administered 2024-01-15: 11 mL via EPIDURAL

## 2024-01-15 MED ORDER — DIPHENHYDRAMINE HCL 50 MG/ML IJ SOLN: 12.5000 mg | INTRAMUSCULAR | Status: DC | PRN

## 2024-01-15 MED ORDER — TETANUS-DIPHTH-ACELL PERTUSSIS 5-2.5-18.5 LF-MCG/0.5 IM SUSY: 0.5000 mL | PREFILLED_SYRINGE | Freq: Once | INTRAMUSCULAR | Status: DC

## 2024-01-15 MED ORDER — IBUPROFEN 600 MG PO TABS
600.0000 mg | ORAL_TABLET | Freq: Four times a day (QID) | ORAL | Status: DC
  Administered 2024-01-15 – 2024-01-16 (×3): 600 mg via ORAL
  Filled 2024-01-15 (×4): qty 1

## 2024-01-15 MED ORDER — SODIUM CHLORIDE 0.9% FLUSH: 3.0000 mL | INTRAVENOUS | Status: DC | PRN

## 2024-01-15 MED ORDER — ONDANSETRON HCL 4 MG PO TABS: 4.0000 mg | ORAL_TABLET | ORAL | Status: DC | PRN

## 2024-01-15 MED ORDER — ZOLPIDEM TARTRATE 5 MG PO TABS: 5.0000 mg | ORAL_TABLET | Freq: Every evening | ORAL | Status: DC | PRN

## 2024-01-15 MED ORDER — PRENATAL MULTIVITAMIN CH
1.0000 | ORAL_TABLET | Freq: Every day | ORAL | Status: DC
  Administered 2024-01-16: 1 via ORAL
  Filled 2024-01-15: qty 1

## 2024-01-15 MED ORDER — BENZOCAINE-MENTHOL 20-0.5 % EX AERO: 1.0000 | INHALATION_SPRAY | CUTANEOUS | Status: DC | PRN

## 2024-01-15 MED ORDER — OXYCODONE HCL 5 MG PO TABS: 5.0000 mg | ORAL_TABLET | ORAL | Status: DC | PRN

## 2024-01-15 MED ORDER — DIBUCAINE (PERIANAL) 1 % EX OINT: 1.0000 | TOPICAL_OINTMENT | CUTANEOUS | Status: DC | PRN

## 2024-01-15 NOTE — Discharge Summary (Signed)
Postpartum Discharge Summary  Date of Service updated***     Patient Name: Kim Sanders DOB: November 08, 1991 MRN: [id number]  Date of admission: 01/14/2024 Delivery date:01/15/2024 Delivering provider: Joanne Gavel Date of discharge: 01/15/2024  Admitting diagnosis: NST (non-stress test) nonreactive [O28.8] Intrauterine pregnancy: [redacted]w[redacted]d     Secondary diagnosis:  Principal Problem:   NST (non-stress test) nonreactive  Additional problems: ***    Discharge diagnosis: Term Pregnancy Delivered                                              Post partum procedures: none Augmentation: AROM, Pitocin, and Cytotec Complications: None  Hospital course: Induction of Labor With Vaginal Delivery   33 y.o. yo [id number] at [redacted]w[redacted]d was admitted to the hospital 01/14/2024 for induction of labor.  Indication for induction:  NRNST .  Patient had an uncomplicated labor course. Membrane Rupture Time/Date: 2:53 PM,01/15/2024  Delivery Method:Vaginal, Spontaneous Operative Delivery:N/A Episiotomy: None Lacerations:  None Details of delivery can be found in separate delivery note.  Patient had a postpartum course complicated by***. Patient is discharged home 01/15/24.  Newborn Data: Birth date:01/15/2024 Birth time:3:05 PM Gender:Female Living status:Living Apgars:9 ,9  Weight:   Magnesium Sulfate received: No BMZ received: No Rhophylac:No MMR:No T-DaP: unknown if received prenatally Flu: N/A - unknown RSV Vaccine received: No - unknown Transfusion:No  Immunizations received: There is no immunization history for the selected administration types on file for this patient.  Physical exam  Vitals:   01/15/24 1515 01/15/24 1530 01/15/24 1545 01/15/24 1600  BP: 125/79 113/67 (!) 135/55 123/87  Pulse: 82 78 73 72  Resp:      Temp:    98.1 F (36.7 C)  TempSrc:    Oral  SpO2:      Weight:      Height:       General: alert, cooperative, and no distress Lochia: {Desc;  appropriate/inappropriate:30686::"appropriate"} Uterine Fundus: {Desc; firm/soft:30687} Incision: {Exam; incision:21111123} DVT Evaluation: {Exam; dvt:2111122} Labs: Lab Results  Component Value Date   WBC 8.4 01/14/2024   HGB 12.5 01/14/2024   HCT 36.1 01/14/2024   MCV 86.4 01/14/2024   PLT 179 01/14/2024      Latest Ref Rng & Units 02/22/2018    3:09 PM  CMP  Glucose 65 - 99 mg/dL 147   BUN 6 - 20 mg/dL 13   Creatinine 8.29 - 1.00 mg/dL 5.62   Sodium 130 - 865 mmol/L 141   Potassium 3.5 - 5.1 mmol/L 4.1   Chloride 101 - 111 mmol/L 107   CO2 22 - 32 mmol/L 25   Calcium 8.9 - 10.3 mg/dL 9.8   Total Protein 6.5 - 8.1 g/dL 8.1   Total Bilirubin 0.3 - 1.2 mg/dL 1.3   Alkaline Phos 38 - 126 U/L 61   AST 15 - 41 U/L 19   ALT 14 - 54 U/L 15    Edinburgh Score:     No data to display         No data recorded  After visit meds:  Allergies as of 01/15/2024   No Known Allergies   Med Rec must be completed prior to using this [facility]***        Discharge home in stable condition Infant Feeding: Breast Infant Disposition:home with mother Discharge instruction: per After Visit Summary and Postpartum booklet. Activity: Advance as  tolerated. Pelvic rest for 6 weeks.  Diet: routine diet Future Appointments:No future appointments. Follow up Visit:   Please schedule this patient for a In person postpartum visit in 1 week with the following provider: {Provider type:23954}. Additional Postpartum F/U:{PP Procedure:23957}  {Risk level:23960} pregnancy complicated by: {complication:23959} Delivery mode:  Vaginal, Spontaneous Anticipated Birth Control:  Depo   01/15/2024 Drema Balzarine, MD

## 2024-01-15 NOTE — Progress Notes (Signed)
Labor Progress Note Kim Sanders is a 33 y.o. [id number] at [redacted]w[redacted]d presented for IOL due to NRNST.  S: At bedside to recheck cx  O:  BP 113/65   Pulse 75   Temp 98 F (36.7 C) (Oral)   Resp 16   SpO2 96%  EFM: 150/mod/+a/+occ variables/lates  CVE: Dilation: Fingertip Effacement (%): 90 Station: -3 Presentation: Vertex Exam by:: Dr Debroah Loop   A&P: 33 y.o. [id number] [redacted]w[redacted]d here for IOL due to NRNST #Labor: Cx rechecked and felt to be the same, Dr. Debroah Loop checked behind me and agrees that cx soft/thin, but still FT. Suspect 2/2 hx cervical procedure -- suspect most of her cervix is soft, but os is stenotic  anticipate as fetal head moves down in pelvis, will start having more dilation  contracting regularly, will cont to watch closely and start Pit if ctx space out #Pain: Desires epidural #FWB: Cat II, overall reassuring #GBS negative  #Hx cervical cancer s/p fertility sparing CKC and primary LND in 2019  plan for annual pap and ECC  f/b gyn/onc   #Psychosocial: Multiple recent stressors  TOC c/s after delivery  Sundra Aland, MD 6:29 AM

## 2024-01-15 NOTE — Anesthesia Procedure Notes (Signed)
Epidural Patient location during procedure: OB Start time: 01/15/2024 6:25 AM End time: 01/15/2024 6:40 AM  Staffing Anesthesiologist: Lowella Curb, MD Performed: anesthesiologist   Preanesthetic Checklist Completed: patient identified, IV checked, site marked, risks and benefits discussed, surgical consent, monitors and equipment checked, pre-op evaluation and timeout performed  Epidural Patient position: sitting Prep: ChloraPrep Patient monitoring: heart rate, cardiac monitor, continuous pulse ox and blood pressure Approach: midline Location: L2-L3 Injection technique: LOR saline  Needle:  Needle type: Tuohy  Needle gauge: 17 G Needle length: 9 cm Needle insertion depth: 6 cm Catheter type: closed end flexible Catheter size: 20 Guage Catheter at skin depth: 10 cm Test dose: negative  Assessment Events: blood not aspirated, injection not painful, no injection resistance, no paresthesia and negative IV test  Additional Notes Reason for block:procedure for pain

## 2024-01-15 NOTE — Progress Notes (Signed)
Labor Progress Note  At bedside to to recheck cx -- fetal head appears to have descended some, cx very thin, however stenotic os still appreciated. Cx examined with speculum, has pinpoint (<0.5cm) os noted. Dr. Debroah Loop at bedside to examine as well -- feel unable to place FB. Will cont to watch ctx and start on Pitocin, anticipate cx will dilate as fetal head descends. Cat II strip, but overall reassuring. Will cont to monitor and sign pt out to dayteam.   Sundra Aland, MD 8:46 AM

## 2024-01-15 NOTE — Lactation Note (Signed)
This note was copied from a baby's chart. Lactation Consultation Note  Patient Name: Kim Sanders [id number]'N Date: 01/15/2024 Age:35 hours Reason for consult: Initial assessment;Term  P4- Per RN, MOB does not want lactation services while in the hospital. MOB has experience with breastfeeding her other children. Please let LC team know if she would like to see [facility] team at any point.  Feeding Mother's Current Feeding Choice: Breast Milk and Formula Nipple Type: Slow - flow  LATCH Score Latch: Grasps breast easily, tongue down, lips flanged, rhythmical sucking.  Audible Swallowing: Spontaneous and intermittent  Type of Nipple: Everted at rest and after stimulation  Comfort (Breast/Nipple): Soft / non-tender  Hold (Positioning): No assistance needed to correctly position infant at breast.  LATCH Score: 10   Consult Status Consult Status: Complete (mother declined follow up) Date: 01/15/24    Dema Severin BS, IBCLC 01/15/2024, 6:37 PM

## 2024-01-15 NOTE — Progress Notes (Signed)
Labor Progress Note Kim Sanders is a 33 y.o. [id number] at [redacted]w[redacted]d presented for IOL for NRNST S: Comfortable with epidural. Discussed AROM, pt would like to wait.  O:  BP 119/66   Pulse 64   Temp 98.2 F (36.8 C) (Oral)   Resp 17   Ht 5\' 6"  (1.676 m)   Wt 88.5 kg   SpO2 98%   BMI 31.47 kg/m   EFM: 150 baseline, accels present, no decels, moderate variablity  CVE: Dilation: 5 Effacement (%): 90 Station: -3 Presentation: Vertex Exam by:: Leanora Cover, MD   A&P: 33 y.o. [id number] [redacted]w[redacted]d admitted for IOL #Labor: Progressing well. Scar tissue manually released. #Pain: Epidural #FWB: Cat #GBS negative  [name], MD 11:43 AM

## 2024-01-15 NOTE — Progress Notes (Signed)
Labor Progress Note Rachele Lamaster is a 33 y.o. [id number] at [redacted]w[redacted]d presented for IOL due to NRNST.  S: Pt reports ctx more intense and regular over past hour.  O:  BP 113/65   Pulse 75   Temp 98 F (36.7 C) (Oral)   Resp 16   SpO2 96%  EFM: 145/mod/+a/+variables  CVE: Dilation: Fingertip Effacement (%): 90 Station: -3 Presentation: Vertex Exam by:: Dr Debroah Loop   A&P: 33 y.o. [id number] [redacted]w[redacted]d here for IOL due to NRNST #Labor: Cx feels FT -- will recheck in 1 hr when she would be due for next intervention #Pain: Desires epidural #FWB: Cat II, overall reassuring #GBS negative  #Hx cervical cancer s/p fertility sparing CKC and primary LND in 2019  plan for annual pap and ECC  f/b gyn/onc   #Psychosocial: Multiple recent stressors  TOC c/s after delivery  Sundra Aland, MD 6:26 AM

## 2024-01-15 NOTE — Anesthesia Preprocedure Evaluation (Signed)
Anesthesia Evaluation  Patient identified by MRN, date of birth, ID band Patient awake    Reviewed: Allergy & Precautions, H&P , NPO status , Patient's Chart, lab work & pertinent test results  Airway Mallampati: II  TM Distance: >3 FB Neck ROM: Full    Dental no notable dental hx.    Pulmonary neg pulmonary ROS   Pulmonary exam normal breath sounds clear to auscultation       Cardiovascular negative cardio ROS Normal cardiovascular exam Rhythm:Regular Rate:Normal     Neuro/Psych negative neurological ROS  negative psych ROS   GI/Hepatic negative GI ROS, Neg liver ROS,,,  Endo/Other  negative endocrine ROS    Renal/GU negative Renal ROS  negative genitourinary   Musculoskeletal negative musculoskeletal ROS (+)    Abdominal   Peds negative pediatric ROS (+)  Hematology negative hematology ROS (+)   Anesthesia Other Findings   Reproductive/Obstetrics (+) Pregnancy                             Anesthesia Physical Anesthesia Plan  ASA: II  Anesthesia Plan: Epidural   Post-op Pain Management:    Induction:   PONV Risk Score and Plan:   Airway Management Planned: Natural Airway  Additional Equipment:   Intra-op Plan:   Post-operative Plan:   Informed Consent: I have reviewed the patients History and Physical, chart, labs and discussed the procedure including the risks, benefits and alternatives for the proposed anesthesia with the patient or authorized representative who has indicated his/her understanding and acceptance.       Plan Discussed with:   Anesthesia Plan Comments:         Anesthesia Quick Evaluation

## 2024-01-16 LAB — CBC
HCT: 34 % — ABNORMAL LOW (ref 36.0–46.0)
Hemoglobin: 11.4 g/dL — ABNORMAL LOW (ref 12.0–15.0)
MCH: 29.8 pg (ref 26.0–34.0)
MCHC: 33.5 g/dL (ref 30.0–36.0)
MCV: 88.8 fL (ref 80.0–100.0)
Platelets: 160 10*3/uL (ref 150–400)
RBC: 3.83 MIL/uL — ABNORMAL LOW (ref 3.87–5.11)
RDW: 13.2 % (ref 11.5–15.5)
WBC: 10 10*3/uL (ref 4.0–10.5)
nRBC: 0 % (ref 0.0–0.2)

## 2024-01-16 MED ORDER — IBUPROFEN 600 MG PO TABS
600.0000 mg | ORAL_TABLET | Freq: Four times a day (QID) | ORAL | 0 refills | Status: AC
Start: 2024-01-16 — End: ?

## 2024-01-16 MED ORDER — ACETAMINOPHEN 325 MG PO TABS: 650.0000 mg | ORAL_TABLET | ORAL | 0 refills | Status: AC | PRN

## 2024-01-16 NOTE — Social Work (Signed)
CSW received a consult for resources CSW met with MOB to offer support and complete assessment. CSW entered the room and observed MOB sitting in the chair holding the infant. CSW arranged [employer] language interpreter Ena Dawley [phone]. CSW introduced self, CSW role and reason  for visit. MOB was agreeable to visit. CSW inquired about how MOB was feeling MOB reported good. CSW inquired about resource needs, MOB reported she received WIC CSW encouraged MOB to call and schedule a  [facility] appointment for her and the baby as soon as possible.  CSW provided MOB with [facility] [employer].  CSW provided education regarding the baby blues period vs. perinatal mood disorders, discussed treatment and gave resources for mental health follow up if concerns arise.  CSW recommends self-evaluation during the postpartum time period using the New Mom Checklist from Postpartum Progress and encouraged MOB to contact a medical professional if symptoms are noted at any time.    CSW provided review of Sudden Infant Death Syndrome (SIDS) precautions.  MOB reported she has all necessary items for the infant including a crib and car seat.  CSW identifies no further need for intervention and no barriers to discharge at this time.  Wende Neighbors, LCSWA Clinical Social Worker [phone]

## 2024-01-16 NOTE — Anesthesia Postprocedure Evaluation (Signed)
Anesthesia Post Note  Patient: Kim Sanders  Procedure(s) Performed: AN AD HOC LABOR EPIDURAL     Patient location during evaluation: Mother Baby Anesthesia Type: Epidural Level of consciousness: awake and alert Pain management: pain level controlled Vital Signs Assessment: post-procedure vital signs reviewed and stable Respiratory status: spontaneous breathing, nonlabored ventilation and respiratory function stable Cardiovascular status: stable Postop Assessment: no headache, no backache and epidural receding Anesthetic complications: no   No notable events documented.  Last Vitals:    Last Pain:                 Collene Schlichter

## 2024-01-16 NOTE — Discharge Instructions (Signed)
WHAT TO LOOK OUT FOR: Fever of 100.4 or above Mastitis: feels like flu and breasts hurt Infection: increased pain, swelling or redness Blood clots golf ball size or larger Postpartum depression   Congratulations on your newest addition!

## 2024-01-17 LAB — SURGICAL PATHOLOGY

## 2024-01-19 ENCOUNTER — Inpatient Hospital Stay (HOSPITAL_COMMUNITY): Payer: Self-pay

## 2024-01-22 ENCOUNTER — Telehealth (HOSPITAL_COMMUNITY): Payer: Self-pay

## 2024-01-22 NOTE — Telephone Encounter (Signed)
 01/22/2024 1600  Name: Kim Sanders MRN: [id number] DOB: 07-08-1991  Reason for Call:  Transition of Care Hospital Discharge Call  Contact Status: Patient Contact Status: Unable to contact Attempted call x2 with spanish interpreter. Someone answered call but then did not speak after saying hello.  Language assistant needed: Interpreter Mode: Telephonic Interpreter Interpreter Name: [phone] Interpreter Phone Number - If applicable: [phone]        Follow-Up Questions:    [name] Postnatal Depression Scale:  In the Past 7 Days:    PHQ2-9 Depression Scale:     Discharge Follow-up:    Post-discharge interventions: NA  Signature  Signe Colt
